# Patient Record
Sex: Female | Born: 1990
Health system: Southern US, Community
[De-identification: ages and names within clinical notes are randomized; demographics above are authoritative.]

## PROBLEM LIST (undated history)

## (undated) DIAGNOSIS — K219 Gastro-esophageal reflux disease without esophagitis: Secondary | ICD-10-CM

## (undated) HISTORY — DX: Gastro-esophageal reflux disease without esophagitis: K21.9

---

## 2015-05-12 ENCOUNTER — Emergency Department (HOSPITAL_COMMUNITY)
Admission: EM | Admit: 2015-05-12 | Discharge: 2015-05-12 | Disposition: A | Discharge status: Nursing Home (Includes ICF) | Payer: Medicaid Other | Source: Home / Self Care | Attending: Family Medicine | Admitting: Family Medicine

## 2015-05-12 ENCOUNTER — Encounter (HOSPITAL_COMMUNITY): Payer: Self-pay | Admitting: Emergency Medicine

## 2015-05-12 ENCOUNTER — Emergency Department (HOSPITAL_COMMUNITY)
Admission: EM | Admit: 2015-05-12 | Discharge: 2015-05-12 | Disposition: A | Discharge status: Home / Self Care | Payer: Medicaid Other | Attending: Emergency Medicine | Admitting: Emergency Medicine

## 2015-05-12 ENCOUNTER — Emergency Department (HOSPITAL_COMMUNITY): Payer: Medicaid Other

## 2015-05-12 DIAGNOSIS — M791 Myalgia: Secondary | ICD-10-CM | POA: Diagnosis present

## 2015-05-12 DIAGNOSIS — R509 Fever, unspecified: Secondary | ICD-10-CM | POA: Diagnosis not present

## 2015-05-12 DIAGNOSIS — R079 Chest pain, unspecified: Secondary | ICD-10-CM | POA: Diagnosis not present

## 2015-05-12 DIAGNOSIS — R000 Tachycardia, unspecified: Secondary | ICD-10-CM | POA: Diagnosis not present

## 2015-05-12 DIAGNOSIS — R0789 Other chest pain: Secondary | ICD-10-CM | POA: Insufficient documentation

## 2015-05-12 DIAGNOSIS — B349 Viral infection, unspecified: Secondary | ICD-10-CM | POA: Diagnosis not present

## 2015-05-12 LAB — COMPREHENSIVE METABOLIC PANEL
ALT: 14 U/L (ref 14–54)
ANION GAP: 9 (ref 5–15)
AST: 17 U/L (ref 15–41)
Albumin: 3.6 g/dL (ref 3.5–5.0)
Alkaline Phosphatase: 43 U/L (ref 38–126)
BILIRUBIN TOTAL: 0.6 mg/dL (ref 0.3–1.2)
BUN: 6 mg/dL (ref 6–20)
CO2: 24 mmol/L (ref 22–32)
Calcium: 9.1 mg/dL (ref 8.9–10.3)
Chloride: 100 mmol/L — ABNORMAL LOW (ref 101–111)
Creatinine, Ser: 0.67 mg/dL (ref 0.44–1.00)
GFR calc Af Amer: >60 mL/min (ref >60)
Glucose, Bld: 95 mg/dL (ref 65–99)
POTASSIUM: 3.1 mmol/L — AB (ref 3.5–5.1)
Sodium: 133 mmol/L — ABNORMAL LOW (ref 135–145)
TOTAL PROTEIN: 7.4 g/dL (ref 6.5–8.1)

## 2015-05-12 LAB — CBC WITH DIFFERENTIAL/PLATELET
Basophils Absolute: 0 10*3/uL (ref 0.0–0.1)
Basophils Relative: 0 %
Eosinophils Absolute: 0 10*3/uL (ref 0.0–0.7)
Eosinophils Relative: 0 %
HEMATOCRIT: 35.6 % — AB (ref 36.0–46.0)
HEMOGLOBIN: 12.1 g/dL (ref 12.0–15.0)
LYMPHS ABS: 1.4 10*3/uL (ref 0.7–4.0)
LYMPHS PCT: 20 %
MCH: 28.7 pg (ref 26.0–34.0)
MCHC: 34 g/dL (ref 30.0–36.0)
MCV: 84.4 fL (ref 78.0–100.0)
MONOS PCT: 17 %
Monocytes Absolute: 1.3 10*3/uL — ABNORMAL HIGH (ref 0.1–1.0)
NEUTROS ABS: 4.6 10*3/uL (ref 1.7–7.7)
NEUTROS PCT: 63 %
Platelets: 233 10*3/uL (ref 150–400)
RBC: 4.22 MIL/uL (ref 3.87–5.11)
RDW: 11.9 % (ref 11.5–15.5)
WBC: 7.3 10*3/uL (ref 4.0–10.5)

## 2015-05-12 LAB — POCT URINALYSIS DIP (DEVICE)
BILIRUBIN URINE: NEGATIVE
Glucose, UA: NEGATIVE mg/dL
Ketones, ur: 15 mg/dL — AB
LEUKOCYTES UA: NEGATIVE
Nitrite: NEGATIVE
Protein, ur: NEGATIVE mg/dL
SPECIFIC GRAVITY, URINE: 1.02 (ref 1.005–1.030)
Urobilinogen, UA: 0.2 mg/dL (ref 0.0–1.0)
pH: 5.5 (ref 5.0–8.0)

## 2015-05-12 LAB — POCT PREGNANCY, URINE: Preg Test, Ur: NEGATIVE

## 2015-05-12 MED ORDER — ACETAMINOPHEN 325 MG PO TABS
ORAL_TABLET | ORAL | Status: AC
  Filled 2015-05-12: qty 2

## 2015-05-12 MED ORDER — ACETAMINOPHEN 325 MG PO TABS
650.0000 mg | ORAL_TABLET | Freq: Once | ORAL | Status: AC
  Administered 2015-05-12: 650 mg via ORAL

## 2015-05-12 MED ORDER — SODIUM CHLORIDE 0.9 % IV BOLUS (SEPSIS): 1000.0000 mL | Freq: Once | INTRAVENOUS | Status: DC

## 2015-05-12 NOTE — ED Provider Notes (Signed)
CSN: [id number]     Arrival date & time 05/12/15  1941 History   First MD Initiated Contact with Patient 05/12/15 2004     Chief Complaint  Patient presents with  . Generalized Body Aches     (Consider location/radiation/quality/duration/timing/severity/associated sxs/prior Treatment) HPI Comments: This is a 24 year old normally healthy female who immigrated from [place] approximately one month ago.  Prior to her immigration.  She has been healthy.  She does report that she had a URI on arrival to the [place], which cleared spontaneously.  The past 3 days.  She's had generalized body aches, sore throat, chest discomfort, cough  which she has been treating with a pain medication from her country, which has helped her discomfort and fever.  Her last menstrual cycle was approximately one month ago.  She is not sexually active.  She does attend school  The history is provided by the patient. The history is limited by a language barrier. A language interpreter was used.    History reviewed. No pertinent past medical history. History reviewed. No pertinent past surgical history. History reviewed. No pertinent family history. Social History  Substance Use Topics  . Smoking status: Never Smoker   . Smokeless tobacco: None  . Alcohol Use: No   OB History    No data available     Review of Systems  Constitutional: Positive for fever.  HENT: Positive for sore throat.   Respiratory: Positive for chest tightness and shortness of breath. Negative for cough.   Gastrointestinal: Negative for nausea, diarrhea and constipation.  Genitourinary: Negative for vaginal bleeding and vaginal discharge.  Skin: Negative for rash.  All other systems reviewed and are negative.     Allergies  Review of patient's allergies indicates no known allergies.  Home Medications   Prior to Admission medications   Medication Sig Start Date End Date Taking? Authorizing Provider  PRESCRIPTION MEDICATION 2  medications from school clinic (one for pain)   Yes Historical Provider, MD   BP 106/73 mmHg  Pulse 100  Temp(Src) 99 F (37.2 C) (Oral)  Resp 16  SpO2 97%  LMP 04/10/2015 (Approximate) Physical Exam  Constitutional: She is oriented to person, place, and time. She appears well-developed and well-nourished.  HENT:  Head: Normocephalic.  Eyes: Pupils are equal, round, and reactive to light.  Neck: Normal range of motion.  Cardiovascular: Normal rate and regular rhythm.   Pulmonary/Chest: Effort normal and breath sounds normal.  Abdominal: Soft. Bowel sounds are normal.  Musculoskeletal: Normal range of motion.  Lymphadenopathy:    She has no cervical adenopathy.  Neurological: She is alert and oriented to person, place, and time.  Skin: Skin is warm. No rash noted. No pallor.  Nursing note and vitals reviewed.   ED Course  Procedures (including critical care time) Labs Review Labs Reviewed  CBC WITH DIFFERENTIAL/PLATELET - Abnormal; Notable for the following:    HCT 35.6 (*)    Monocytes Absolute 1.3 (*)    All other components within normal limits  COMPREHENSIVE METABOLIC PANEL - Abnormal; Notable for the following:    Sodium 133 (*)    Potassium 3.1 (*)    Chloride 100 (*)    All other components within normal limits    Imaging Review Dg Chest 2 View  05/12/2015  CLINICAL DATA:  Cough and fever.  Body aches. EXAM: CHEST  2 VIEW COMPARISON:  None. FINDINGS: The cardiomediastinal contours are normal. The lungs are clear. Pulmonary vasculature is normal. No consolidation, pleural  effusion, or pneumothorax. No acute osseous abnormalities are seen. IMPRESSION: No acute pulmonary process. Electronically Signed   By: Rubye Oaks M.D.   On: 05/12/2015 22:00   I have personally reviewed and evaluated these images and lab results as part of my medical decision-making.   EKG Interpretation None     urine sample obtained at urgent care is normal.  I will obtain chest  x-ray.  On arrival to our emergency room.  Patient is no longer febrile with a temperature of 99.4 and a pulse of 100.  She does not appear to be any distress at this time  MDM   Final diagnoses:  Viral syndrome         Earley Favor, NP 05/13/15 2254  Richardean Canal, MD 05/13/15 [phone]

## 2015-05-12 NOTE — Discharge Instructions (Signed)
It was nice seeing you today. I am sorry you don't feel well. I will like for you to go to the ED to get blood work done foe blood infection.   Fever, Adult A fever is an increase in the body's temperature. It is often defined as a temperature of 100 F (38C) or higher. Short mild or moderate fevers often have no long-term effects. They also often do not need treatment. Moderate or high fevers may make you feel uncomfortable. Sometimes, they can also be a sign of a serious illness or disease. The sweating that may happen with repeated fevers or fevers that last a while may also cause you to not have enough fluid in your body (dehydration). You can take your temperature with a thermometer to see if you have a fever. A measured temperature can change with:  Age.  Time of day.  Where the thermometer is placed:  Mouth (oral).  Rectum (rectal).  Ear (tympanic).  Underarm (axillary).  Forehead (temporal). HOME CARE Pay attention to any changes in your symptoms. Take these actions to help with your condition:  Take over-the-counter and prescription medicines only as told by your doctor. Follow the dosing instructions carefully.  If you were prescribed an antibiotic medicine, take it as told by your doctor. Do not stop taking the antibiotic even if you start to feel better.  Rest as needed.  Drink enough fluid to keep your pee (urine) clear or pale yellow.  Sponge yourself or bathe with room-temperature water as needed. This helps to lower your body temperature . Do not use ice water.  Do not wear too many blankets or heavy clothes. GET HELP IF:  You throw up (vomit).  You cannot eat or drink without throwing up.  You have watery poop (diarrhea).  It hurts when you pee.  Your symptoms do not get better with treatment.  You have new symptoms.  You feel very weak. GET HELP RIGHT AWAY IF:  You are short of breath or have trouble breathing.  You are dizzy or you pass out  (faint).  You feel confused.  You have signs of not having enough fluid in your body, such as:  A dry mouth.  Peeing less.  Looking pale.  You have very bad pain in your belly (abdomen).  You keep throwing up or having water poop.  You have a skin rash.  Your symptoms suddenly get worse.   This information is not intended to replace advice given to you by your health care provider. Make sure you discuss any questions you have with your health care provider.   Document Released: 03/18/2008 Document Revised: 02/28/2015 Document Reviewed: 08/03/2014 Elsevier Interactive Patient Education [employer]2016 Elsevier Inc.

## 2015-05-12 NOTE — ED Notes (Signed)
Pt roomed prior to Triage completion due to needed translator.

## 2015-05-12 NOTE — Discharge Instructions (Signed)
341

## 2015-05-12 NOTE — ED Notes (Signed)
Fever, general body aches.  Denies sore throat, pain in chest and back and with breathing per patient

## 2015-05-12 NOTE — ED Notes (Signed)
[place] [employer] div of Medical assistance card has DOB Sep 05, 1990

## 2015-05-12 NOTE — ED Provider Notes (Signed)
CSN: [id number]     Arrival date & time 05/12/15  1743 History   First MD Initiated Contact with Patient 05/12/15 1828     No chief complaint on file.  (Consider location/radiation/quality/duration/timing/severity/associated sxs/prior Treatment) Patient is a 24 y.o. female presenting with fever. The history is provided by the patient and a relative. No language interpreter was used.  Fever Temp source:  Subjective Severity:  Moderate Onset quality:  Gradual Duration:  2 days Timing:  Intermittent Progression:  Waxing and waning Worsened by:  Nothing tried Ineffective treatments: She took some pain medication but she does not know what it is. Associated symptoms: chest pain, dysuria, ear pain, headaches and myalgias   Associated symptoms: no cough, no diarrhea, no nausea, no sore throat and no vomiting   Patient recently migrated to the [place] from [place] last month. Chest pain: This is aggravated by deep breathing. Pain does not radiate and it is centrally located. She denies SOB.  No past medical history on file. No past surgical history on file. No family history on file. Social History  Substance Use Topics  . Smoking status: Not on file  . Smokeless tobacco: Not on file  . Alcohol Use: Not on file   OB History    No data available     Review of Systems  Constitutional: Positive for fever.  HENT: Positive for ear pain. Negative for sore throat.   Respiratory: Negative for cough.   Cardiovascular: Positive for chest pain.  Gastrointestinal: Negative for nausea, vomiting and diarrhea.  Genitourinary: Positive for dysuria.  Musculoskeletal: Positive for myalgias.  Neurological: Positive for headaches.  All other systems reviewed and are negative.   Allergies  Review of patient's allergies indicates not on file.  Home Medications   Prior to Admission medications   Not on File   Meds Ordered and Administered this Visit  Medications - No data to display  BP 119/79  mmHg  Pulse 124  Temp(Src) 102.5 F (39.2 C) (Oral)  Resp 22  SpO2 99% No data found.   Physical Exam  Constitutional: She is oriented to person, place, and time. She appears distressed.  Cardiovascular: Regular rhythm, normal heart sounds and intact distal pulses.  Tachycardia present.   No murmur heard. Pulmonary/Chest: Effort normal and breath sounds normal. No respiratory distress. She has no wheezes.  Abdominal: Soft. Bowel sounds are normal. She exhibits no distension and no mass. There is no tenderness.  Musculoskeletal: Normal range of motion. She exhibits no edema.  Neurological: She is alert and oriented to person, place, and time. No cranial nerve deficit.  Skin: She is not diaphoretic.  Vitals reviewed.   ED Course  Procedures (including critical care time)  Labs Review Labs Reviewed - No data to display  Imaging Review No results found.   Visual Acuity Review  Right Eye Distance:   Left Eye Distance:   Bilateral Distance:    Right Eye Near:   Left Eye Near:    Bilateral Near:      Urinalysis    Component Value Date/Time   LABSPEC 1.020 05/12/2015 1850   PHURINE 5.5 05/12/2015 1850   GLUCOSEU NEGATIVE 05/12/2015 1850   HGBUR MODERATE* 05/12/2015 1850   BILIRUBINUR NEGATIVE 05/12/2015 1850   KETONESUR 15* 05/12/2015 1850   PROTEINUR NEGATIVE 05/12/2015 1850   UROBILINOGEN 0.2 05/12/2015 1850   NITRITE NEGATIVE 05/12/2015 1850   LEUKOCYTESUR NEGATIVE 05/12/2015 1850    EKG: sinus tachycardia.     MDM  No diagnosis  found. Fever, unspecified fever cause  Chest pain, unspecified chest pain type  Sinus tachycardia (HCC)  Etiology of fever currently unclear. Differentials include sepsis. She appears very sick and in distress. I will like her to go to the ED for blood work. Since she recently came from [place] she might also have[place] malaria or other travel related disease.   EKG report reviewed by me: Sinus tachycardia. No Ischemic changes.  Likely related to fever. Plan to send to the ED for further evaluation and monitoring.    Doreene ElandKehinde T Neziah Vogelgesang, MD 05/12/15 [phone]

## 2015-05-12 NOTE — ED Notes (Signed)
Pt. reports generalized body aches , SOB , occasional cough and chest congestion onset last Thursday , denies fever or chills . Arabic interpreter used at triage .

## 2015-05-24 ENCOUNTER — Emergency Department (HOSPITAL_COMMUNITY)
Admission: EM | Admit: 2015-05-24 | Discharge: 2015-05-24 | Disposition: A | Discharge status: Home / Self Care | Payer: Medicaid Other | Attending: Emergency Medicine | Admitting: Emergency Medicine

## 2015-05-24 ENCOUNTER — Encounter (HOSPITAL_COMMUNITY): Payer: Self-pay

## 2015-05-24 DIAGNOSIS — R064 Hyperventilation: Secondary | ICD-10-CM | POA: Diagnosis not present

## 2015-05-24 DIAGNOSIS — R0602 Shortness of breath: Secondary | ICD-10-CM | POA: Diagnosis present
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
## 2015-05-24 NOTE — ED Notes (Signed)
[place] [employer] AT THE BEDSIDE.
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
## 2015-05-24 NOTE — ED Notes (Signed)
Bed: WA09 Expected date:  Expected time:  Means of arrival:  Comments: Triage 1 
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
## 2015-05-24 NOTE — ED Notes (Signed)
MD at bedside with RN help to interpret.

## 2015-05-24 NOTE — Discharge Instructions (Signed)
Hyperventilation °Hyperventilation is breathing that is deeper and more rapid than normal. It is usually associated with panic and anxiety. Hyperventilation can make you feel breathless. It is sometimes called overbreathing. Breathing out too much causes a decrease in the amount of carbon dioxide gas in the blood. This leads to tingling and numbness in the hands, feet, and around the mouth. If this continues, your fingers, hands, and toes may begin to spasm. Hyperventilation usually lasts 20-30 minutes and can be associated with other symptoms of panic and anxiety, including:  °· Chest pains or tightness. °· A pounding or irregular, racing heartbeat (palpitations). °· Dizziness. °· Lightheadedness. °· Dry mouth. °· Weakness. °· Confusion. °· Sleep disturbance. °CAUSES  °Sudden onset (acute) hyperventilation is usually triggered by acute stress, anxiety, or emotional upset. Long-term (chronic) and recurring hyperventilation can occur with chronic lung problems, such emphysema or asthma. Other causes include:  °· Nervousness. °· Stress. °· Stimulant, drug, or alcohol use. °· Lung disease. °· Infections, such as pneumonia. °· Heart problems. °· Severe pain. °· Waking from a bad dream. °· Pregnancy. °· Bleeding. °HOME CARE INSTRUCTIONS °· Learn and use breathing exercises that help you breathe from your diaphragm and abdomen. °· Practice relaxation techniques to reduce stress, such as visualization, meditation, and muscle release. °· During an attack, try breathing into a paper bag. This changes the carbon dioxide level and slows down breathing. °SEEK IMMEDIATE MEDICAL CARE IF: °· Your hyperventilation continues or gets worse. °MAKE SURE YOU: °· Understand these instructions. °· Will watch your condition. °· Will get help right away if you are not doing well or get worse. °  °This information is not intended to replace advice given to you by your health care provider. Make sure you discuss any questions you have with  your health care provider. °  °Document Released: 06/06/2000 Document Revised: 12/09/2011 Document Reviewed: 09/18/2011 °Elsevier Interactive Patient Education ©2016 Elsevier Inc. ° °
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
## 2015-06-27 DIAGNOSIS — N912 Amenorrhea, unspecified: Secondary | ICD-10-CM

## 2015-06-27 DIAGNOSIS — R1013 Epigastric pain: Secondary | ICD-10-CM

## 2015-06-27 NOTE — Patient Instructions (Signed)
Appointment scheduled at [facility] 07/02/15 @ 2:00 pm. Continue to use medication prviously given in ER.  Refer to Urgent Care if pain increases.

## 2015-06-27 NOTE — Congregational Nurse Program (Signed)
Congregational Nurse Program Note  Date of Encounter: 06/27/2015  Past Medical History: No past medical history on file.  Encounter Details:     CNP Questionnaire - 06/27/15 2336    Patient Demographics   Is this a new or existing patient? Existing   Patient is considered a/an Refugee   Race African   Patient Assistance   Location of Patient Assistance Not Applicable   Patient's financial/insurance status Medicaid   Uninsured Patient No   Patient referred to apply for the following financial assistance Not Applicable   Food insecurities addressed Not Applicable   Transportation assistance No   Assistance securing medications No   Educational health offerings Not Applicable   Encounter Details   Primary purpose of visit Acute Illness/Condition Visit   Was an Emergency Department visit averted? Yes   Does patient have a medical provider? Yes   Patient referred to Follow up with established PCP   Was a mental health screening completed? (GAINS tool) No   Does patient have dental issues? No   Since previous encounter, have you referred patient for abnormal blood pressure that resulted in a new diagnosis or medication change? No   Since previous encounter, have you referred patient for abnormal blood glucose that resulted in a new diagnosis or medication change? No   For Abstraction Use Only   Does patient have insurance? No     Office visit requesting referral to PCP to follow-up chronic R upper abdominal pain previously treated at [facility].  Interview required Arabic interpreter. Reports menses absent since 2 months. No previous pregnancies.

## 2015-07-02 ENCOUNTER — Ambulatory Visit: Payer: Medicaid Other | Admitting: Internal Medicine

## 2015-07-09 ENCOUNTER — Ambulatory Visit: Payer: Medicaid Other | Attending: Internal Medicine | Admitting: Internal Medicine

## 2015-07-09 ENCOUNTER — Encounter: Payer: Self-pay | Admitting: Internal Medicine

## 2015-07-09 VITALS — BP 115/76 | HR 93 | Temp 98.0°F | Resp 16 | Ht <= 58 in | Wt 126.8 lb

## 2015-07-09 DIAGNOSIS — F4329 Adjustment disorder with other symptoms: Secondary | ICD-10-CM

## 2015-07-09 DIAGNOSIS — K219 Gastro-esophageal reflux disease without esophagitis: Secondary | ICD-10-CM

## 2015-07-09 DIAGNOSIS — G4700 Insomnia, unspecified: Secondary | ICD-10-CM

## 2015-07-09 DIAGNOSIS — R109 Unspecified abdominal pain: Secondary | ICD-10-CM | POA: Diagnosis not present

## 2015-07-09 LAB — POCT URINALYSIS DIPSTICK
Bilirubin, UA: NEGATIVE
Glucose, UA: NEGATIVE
KETONES UA: NEGATIVE
Leukocytes, UA: NEGATIVE
Nitrite, UA: NEGATIVE
PH UA: 7.5
SPEC GRAV UA: 1.02
UROBILINOGEN UA: 1

## 2015-07-09 LAB — POCT URINE PREGNANCY: Preg Test, Ur: NEGATIVE

## 2015-07-09 MED ORDER — NAPROXEN 500 MG PO TABS: 500.0000 mg | ORAL_TABLET | Freq: Two times a day (BID) | ORAL | Status: DC

## 2015-07-09 MED ORDER — OMEPRAZOLE 20 MG PO CPDR: 20.0000 mg | DELAYED_RELEASE_CAPSULE | Freq: Every day | ORAL | Status: DC

## 2015-07-09 MED FILL — ?OMEPRAZOLE DR 20 MG CAPSUL: 20 | 30 days supply | Qty: 30 | Fill #0

## 2015-07-09 MED FILL — NAPROXEN 500 MG TABLET: 500 | 15 days supply | Qty: 30 | Fill #0

## 2015-07-09 NOTE — Progress Notes (Signed)
Patient ID: Ashley Holt, female   DOB: 03/25/1991, 25 y.o.   MRN: [id number]  [id number]  [id number]  DOB - 03/05/1991  CC:  Chief Complaint  Patient presents with  . Establish Care       HPI: Ashley Holt [place] is a 25 y.o. female here today to establish medical care. Patient has no past medical history and is not on any current medications. Patient reports that she has been having a burning sensation in her abdomen and chest after she eats. She feels like she is bloated and has gas. She has not tried anything for acid reflux since being in [place] for the past 3 months. She reports that while in [place] she was on a specific medication but she is unable to recall the name of the medication.  Patient is also concerned about menstrual irregularities since being in [place] for the past 3 months. She reports that in her country her cycles were always regular. She does admit to emotional stress and insomnia as well. She states that she only sleeps 3 hours per night due to worrying about different issues.   No Known Allergies Past Medical History  Diagnosis Date  . GERD (gastroesophageal reflux disease)    No current outpatient prescriptions on file prior to visit.   No current facility-administered medications on file prior to visit.   History reviewed. No pertinent family history. Social History   Social History  . Marital Status: Single    Spouse Name: N/A  . Number of Children: N/A  . Years of Education: N/A   Occupational History  . Not on file.   Social History Main Topics  . Smoking status: Never Smoker   . Smokeless tobacco: Not on file  . Alcohol Use: No  . Drug Use: No  . Sexual Activity: Not on file   Other Topics Concern  . Not on file   Social History Narrative    Review of Systems: Other than what is stated in HPI, all other systems are negative.   Objective:   Filed Vitals:   07/09/15 1451  BP: 115/76  Pulse: 93  Temp: 98 F (36.7 C)  Resp: 16     Physical Exam  Constitutional: She is oriented to person, place, and time.  Cardiovascular: Normal rate, regular rhythm and normal heart sounds.   Pulmonary/Chest: Effort normal and breath sounds normal.  Abdominal: Soft. Bowel sounds are normal. She exhibits no distension and no mass. There is no tenderness.  Neurological: She is alert and oriented to person, place, and time.  Skin: Skin is warm.    No results found for: WBC, HGB, HCT, MCV, PLT No results found for: CREATININE, BUN, NA, K, CL, CO2  No results found for: HGBA1C Lipid Panel  No results found for: CHOL, TRIG, HDL, CHOLHDL, VLDL, LDLCALC     Assessment and plan:   Ashley Holt was seen today for establish care.  Diagnoses and all orders for this visit:  Abdominal discomfort -     Urinalysis Dipstick -     POCT urine pregnancy -     naproxen (NAPROSYN) 500 MG tablet; Take 1 tablet (500 mg total) by mouth 2 (two) times daily with a meal. For menstrual cramps  Gastroesophageal reflux disease, esophagitis presence not specified -     Begin omeprazole (PRILOSEC) 20 MG capsule; Take 1 capsule (20 mg total) by mouth daily before breakfast. For acid reflux Discussed diet and weight with patient relating to acid reflux.  Went over things  that may exacerbate acid reflux such as tomatoes, spicy foods, coffee, carbonated beverages, chocolates, etc.  Advised patient to avoid laying down at least two hours after meals and sleep with HOB elevated.   Insomnia Patient will try Melatonin nightly for sleep. Went over sleep hygiene  Stress and adjustment reaction Went over coping techniques. Likely what is causing menstrual abnormalities and insomnia  Due to language barrier, an interpreter was present during the history-taking and subsequent discussion (and for part of the physical exam) with this patient.   Return if symptoms worsen or fail to improve.   Ambrose Finland, NP-C [facility] [phone] 07/09/2015, 3:04 PM

## 2015-07-09 NOTE — Progress Notes (Signed)
Interpreter line used Tarek ID# [id number] Patient here to establish care Complains of having a burning sensation after she eats and also Having some abd discomfort after she eats as well Patient takes no prescribed medications

## 2015-07-09 NOTE — Patient Instructions (Signed)
Melatonin 5 mg at bedtime for sleep----[facility]

## 2015-07-10 DIAGNOSIS — R1013 Epigastric pain: Secondary | ICD-10-CM

## 2015-07-12 ENCOUNTER — Encounter (HOSPITAL_COMMUNITY): Payer: Self-pay | Admitting: Emergency Medicine

## 2015-07-16 NOTE — Congregational Nurse Program (Signed)
Congregational Nurse Program Note  Date of Encounter: 07/10/2015  Past Medical History: Past Medical History  Diagnosis Date  . GERD (gastroesophageal reflux disease)     Encounter Details:     CNP Questionnaire - 07/16/15 2049    Patient Demographics   Is this a new or existing patient? Existing   Patient is considered a/an Refugee   Race African   Patient Assistance   Location of Patient Assistance Not Applicable   Patient's financial/insurance status Medicaid   Uninsured Patient No   Patient referred to apply for the following financial assistance Medicaid   Food insecurities addressed Not Applicable   Transportation assistance No   Assistance securing medications No   Educational health offerings Medications   Encounter Details   Primary purpose of visit Education/Health Concerns;Post ED/Hospitalization Visit   Was an Emergency Department visit averted? Not Applicable   Does patient have a medical provider? Yes   Patient referred to Follow up with established PCP   Was a mental health screening completed? (GAINS tool) No   Does patient have dental issues? No   Since previous encounter, have you referred patient for abnormal blood pressure that resulted in a new diagnosis or medication change? No   Since previous encounter, have you referred patient for abnormal blood glucose that resulted in a new diagnosis or medication change? No         Amb Nursing Assessment - 07/09/15 1451    Pre-visit preparation   Pre-visit preparation completed Yes   Pain Assessment   Pain Assessment No/denies pain      Office visit at [facility] with questions regarding PCP visit at [facility] visit on 07-09-15. Complained of abdominal/gastric pain several weeks and absence of menses. B/P 103/71. Pulse 71. Received medications (see chart), Naproxen 500 mg, Melatonin 5 mg/OTC,  for sleep and Omeprazole  from pharmacy as ordered, but need assistance understanding dose schedule. Pregnancy test  negative per report. Plan: Review discharge report with medications and lab values. Used Arabic telephone interpreter to review orders and interview. Plan: Schedule follow-up appointment with PCP. Develop medication chart for meds. Refer to [facility] for evaluation and assessment if abdominal pain continues. Collaborate with PCP as needed in care.

## 2015-09-18 DIAGNOSIS — R1013 Epigastric pain: Secondary | ICD-10-CM

## 2015-09-22 NOTE — Congregational Nurse Program (Unsigned)
Congregational Nurse Program Note  Date of Encounter: 09/18/2015  Past Medical History: Past Medical History  Diagnosis Date  . GERD (gastroesophageal reflux disease)     Encounter Details:     CNP Questionnaire - 09/18/15 0118    Patient Demographics   Is this a new or existing patient? Existing   Patient is considered a/an Refugee   Patient Assistance   Location of Patient Assistance Not Applicable   Patient's financial/insurance status Medicaid   Uninsured Patient No   Patient referred to apply for the following financial assistance Not Applicable   Food insecurities addressed Not Applicable   Transportation assistance No   Assistance securing medications No   Educational health offerings Navigating the healthcare system   Encounter Details   Primary purpose of visit Navigating the Healthcare System;Post PCP Visit   Was an Emergency Department visit averted? Not Applicable   Does patient have a medical provider? Yes   Patient referred to Follow up with established PCP   Was a mental health screening completed? (GAINS tool) No   Does patient have dental issues? No   Does patient have vision issues? No   Since previous encounter, have you referred patient for abnormal blood pressure that resulted in a new diagnosis or medication change? No   Since previous encounter, have you referred patient for abnormal blood glucose that resulted in a new diagnosis or medication change? No     Nurse office visit at [facility] to request assistance in scheduling PCP appointment for re-evaluation of reoccurring epigastric and ? abdominal pain. Arabic speaking [place] lady speaking limited English, employed at [employer] in [place] attending  English classes in between work schedule.  [facility], September 26, 2015 at 2:00pm. Ferol LuzMarietta Zechariah Bissonnette, RN/Congregational Nurse.

## 2015-09-26 ENCOUNTER — Ambulatory Visit: Payer: Self-pay | Admitting: Internal Medicine

## 2016-05-30 LAB — GLUCOSE, POCT (MANUAL RESULT ENTRY): POC Glucose: 109 mg/dl — AB (ref 70–99)

## 2016-07-28 ENCOUNTER — Other Ambulatory Visit: Payer: Self-pay

## 2016-07-28 ENCOUNTER — Encounter: Payer: Self-pay | Admitting: Family Medicine

## 2016-07-28 ENCOUNTER — Ambulatory Visit: Payer: Self-pay | Attending: Family Medicine | Admitting: Family Medicine

## 2016-07-28 VITALS — BP 94/63 | HR 75 | Temp 98.8°F | Resp 18 | Ht 59.0 in | Wt 119.4 lb

## 2016-07-28 DIAGNOSIS — K219 Gastro-esophageal reflux disease without esophagitis: Secondary | ICD-10-CM | POA: Insufficient documentation

## 2016-07-28 DIAGNOSIS — K9049 Malabsorption due to intolerance, not elsewhere classified: Secondary | ICD-10-CM | POA: Insufficient documentation

## 2016-07-28 DIAGNOSIS — Z79899 Other long term (current) drug therapy: Secondary | ICD-10-CM | POA: Insufficient documentation

## 2016-07-28 DIAGNOSIS — F5102 Adjustment insomnia: Secondary | ICD-10-CM | POA: Insufficient documentation

## 2016-07-28 DIAGNOSIS — Z9102 Food additives allergy status: Secondary | ICD-10-CM | POA: Insufficient documentation

## 2016-07-28 DIAGNOSIS — R002 Palpitations: Secondary | ICD-10-CM | POA: Insufficient documentation

## 2016-07-28 MED ORDER — RANITIDINE HCL 150 MG PO TABS: 150.0000 mg | ORAL_TABLET | Freq: Two times a day (BID) | ORAL | 2 refills | Status: DC

## 2016-07-28 MED ORDER — TRAZODONE HCL 50 MG PO TABS: 25.0000 mg | ORAL_TABLET | Freq: Every evening | ORAL | 0 refills | Status: DC | PRN

## 2016-07-28 MED ORDER — DIPHENHYDRAMINE HCL 25 MG PO CAPS: 25.0000 mg | ORAL_CAPSULE | Freq: Four times a day (QID) | ORAL | 0 refills | Status: DC | PRN

## 2016-07-28 MED ORDER — EPINEPHRINE 0.3 MG/0.3ML IJ SOAJ: 0.3000 mg | Freq: Once | INTRAMUSCULAR | 0 refills | Status: AC

## 2016-07-28 MED FILL — raNITIdine HCL 150 MG TABS: 150 | 30 days supply | Qty: 60 | Fill #0

## 2016-07-28 NOTE — Patient Instructions (Addendum)
Food Allergy Introduction A food allergy is when your body reacts to a food in a way that is not normal. The reaction can be gentle or very bad. Signs of a Gentle Reaction  Stuffy nose.  Tingling in the mouth.  An itchy, red rash.  Throwing up (vomiting).  Watery poop (diarrhea). Signs of a Very Bad Reaction  Puffiness (swelling). This may be on the lips, face, or tongue, or in the mouth or throat.  Breathing loudly (wheezing).  A hoarse voice.  Itchy, red, swollen areas of skin (hives).  Dizziness or light-headedness.  Fainting.  Trouble breathing or swallowing.  A tight feeling in the chest.  A very fast heartbeat. Follow these instructions at home: General instructions  Avoid the foods that you are allergic to.  Read food labels. Look for ingredients that you are allergic to.  When you are at a restaurant, tell your server that you have an allergy. Ask if your meal has an ingredient that you are allergic to.  Take medicines only as told by your doctor. Do not drive until the medicine has worn off, unless your doctor says it is okay.  Tell all people who care for you that you have a food allergy. This includes your doctor and dentist.  If you think that you might be allergic to something else, talk with your doctor. Do not eat a food to see if you are allergic to it without talking with your doctor first. If you have a very bad allergy:  Wear a bracelet or necklace that says you have an allergy.  Carry your allergy kit (anaphylaxis kit) or an allergy shot (epinephrine injection) with you all the time. Use them as told by your doctor.  Make sure that you, your family, and your boss know:  How to use your allergy kit.  How to give you an allergy shot.  If you use the medicine epinephrine:  Get more right away in case you have another reaction.  Get help. You can have a life-threatening reaction after taking the medicine. If you are being tested for an  allergy:  Follow a diet as told by your doctor.  Keep a food diary as told by your doctor. Every day, write down:  What you eat and drink and when.  What problems you have and when. Contact a doctor if:  The signs of your reaction have not gone away within 2 days.  The signs of your reaction get worse.  You have new signs of a reaction. Get help right away if:  You use the medicine epinephrine.  You are having a very bad reaction. Signs of a very bad reaction are:  Puffiness. This may be on the lips, face, or tongue, or in the mouth or throat.  Breathing loudly.  A hoarse voice.  Itchy, red swollen areas of skin.  Dizziness or light-headedness.  Fainting.  Trouble breathing or swallowing.  A tight feeling in the chest.  A very fast heartbeat. This information is not intended to replace advice given to you by your health care provider. Make sure you discuss any questions you have with your health care provider. Document Released: 11/27/2009 Document Revised: 11/15/2015 Document Reviewed: 03/21/2014  2017 Elsevier  Food Choices for Gastroesophageal Reflux Disease, Adult When you have gastroesophageal reflux disease (GERD), the foods you eat and your eating habits are very important. Choosing the right foods can help ease your discomfort. What guidelines do I need to follow?  Choose fruits,  vegetables, whole grains, and low-fat dairy products.  Choose low-fat meat, fish, and poultry.  Limit fats such as oils, salad dressings, butter, nuts, and avocado.  Keep a food diary. This helps you identify foods that cause symptoms.  Avoid foods that cause symptoms. These may be different for everyone.  Eat small meals often instead of 3 large meals a day.  Eat your meals slowly, in a place where you are relaxed.  Limit fried foods.  Cook foods using methods other than frying.  Avoid drinking alcohol.  Avoid drinking large amounts of liquids with your  meals.  Avoid bending over or lying down until 2-3 hours after eating. What foods are not recommended? These are some foods and drinks that may make your symptoms worse: Vegetables  Tomatoes. Tomato juice. Tomato and spaghetti sauce. Chili peppers. Onion and garlic. Horseradish. Fruits  Oranges, grapefruit, and lemon (fruit and juice). Meats  High-fat meats, fish, and poultry. This includes hot dogs, ribs, ham, sausage, salami, and bacon. Dairy  Whole milk and chocolate milk. Sour cream. Cream. Butter. Ice cream. Cream cheese. Drinks  Coffee and tea. Bubbly (carbonated) drinks or energy drinks. Condiments  Hot sauce. Barbecue sauce. Sweets/Desserts  Chocolate and cocoa. Donuts. Peppermint and spearmint. Fats and Oils  High-fat foods. This includes [place] fries and potato chips. Other  Vinegar. Strong spices. This includes black pepper, white pepper, red pepper, cayenne, curry powder, cloves, ginger, and chili powder. The items listed above may not be a complete list of foods and drinks to avoid. Contact your dietitian for more information.  This information is not intended to replace advice given to you by your health care provider. Make sure you discuss any questions you have with your health care provider. Document Released: 12/09/2011 Document Revised: 11/15/2015 Document Reviewed: 04/13/2013 Elsevier Interactive Patient Education  2017 Elsevier Inc.  Epinephrine injection (Auto-injector) What is this medicine? EPINEPHRINE (ep i NEF rin) is used for the emergency treatment of severe allergic reactions. You should keep this medicine with you at all times. This medicine may be used for other purposes; ask your health care provider or pharmacist if you have questions. COMMON BRAND NAME(S): Adrenaclick, Auvi-Q, epinephrinesnap, epinephrinesnap-v, EpiPen, EPIsnap Epinephrine, SYMJEPI, Twinject What should I tell my health care provider before I take this medicine? They need to know  if you have any of the following conditions: -diabetes -heart disease -high blood pressure -lung or breathing disease, like asthma -Parkinson's disease -thyroid disease -an unusual or allergic reaction to epinephrine, sulfites, other medicines, foods, dyes, or preservatives -pregnant or trying to get pregnant -breast-feeding How should I use this medicine? This medicine is for injection into the outer thigh. Your doctor or health care professional will instruct you on the proper use of the device during an emergency. Read all directions carefully and make sure you understand them. Do not use more often than directed. Talk to your pediatrician regarding the use of this medicine in children. Special care may be needed. This drug is commonly used in children. A special device is available for use in children. If you are giving this medicine to a young child, hold their leg firmly in place before and during the injection to prevent injury. Overdosage: If you think you have taken too much of this medicine contact a poison control center or emergency room at once. NOTE: This medicine is only for you. Do not share this medicine with others. What if I miss a dose? This does not apply. You should only  use this medicine for an allergic reaction. What may interact with this medicine? This medicine is only used during an emergency. Significant drug interactions are not likely during emergency use. This list may not describe all possible interactions. Give your health care provider a list of all the medicines, herbs, non-prescription drugs, or dietary supplements you use. Also tell them if you smoke, drink alcohol, or use illegal drugs. Some items may interact with your medicine. What should I watch for while using this medicine? Keep this medicine ready for use in the case of a severe allergic reaction. Make sure that you have the phone number of your doctor or health care professional and local hospital  ready. Remember to check the expiration date of your medicine regularly. You may need to have additional units of this medicine with you at work, school, or other places. Talk to your doctor or health care professional about your need for extra units. Some emergencies may require an additional dose. Check with your doctor or a health care professional before using an extra dose. After use, go to the nearest hospital or call 911. Avoid physical activity. Make sure the treating health care professional knows you have received an injection of this medicine. You will receive additional instructions on what to do during and after use of this medicine before a medical emergency occurs. What side effects may I notice from receiving this medicine? Side effects that you should report to your doctor or health care professional as soon as possible: -allergic reactions like skin rash, itching or hives, swelling of the face, lips, or tongue -breathing problems -chest pain -fast, irregular heartbeat -pain, tingling, numbness in the hands or feet -pain, redness, or irritation at site where injected -vomiting Side effects that usually do not require medical attention (report to your doctor or health care professional if they continue or are bothersome): -anxious -dizziness -dry mouth -headache -increased sweating -nausea -unusually weak or tired This list may not describe all possible side effects. Call your doctor for medical advice about side effects. You may report side effects to FDA at 1-800-FDA-1088. Where should I keep my medicine? Keep out of the reach of children. Store at room temperature between 15 and 30 degrees C (59 and 86 degrees F). Protect from light and heat. The solution should be clear in color. If the solution is discolored or contains particles it must be replaced. Throw away any unused medicine after the expiration date. Ask your doctor or pharmacist about proper disposal of the injector  if it is expired or has been used. Always replace your auto-injector before it expires. NOTE: This sheet is a summary. It may not cover all possible information. If you have questions about this medicine, talk to your doctor, pharmacist, or health care provider.  2017 Elsevier/Gold Standard (2014-11-13 12:24:50)

## 2016-07-28 NOTE — Progress Notes (Signed)
Patient is here to establish care  Patient complains of breaking out in hives after consuming bananas, oranges and strawberries. Patient states she was not allergic to these fruits in [place]. Patient has been in the states for one year.  Patient complains of heart racing intermittently at night when she lays down to sleep.  Patient denies pain at this time.  Patient declined the flu vaccine today.  Patient has not taken any medication today. Patient has not eaten today.

## 2016-07-28 NOTE — Progress Notes (Signed)
Subjective:  Patient ID: Ashley Holt, female    DOB: 05/31/1991  Age: 26 y.o. MRN: [id number]  CC: Establish Care   HPI Ashley Holt presents for complaints of itching when eating certain foods for the past 6 months. Reports hives and itching after eating certain foods. Reports symptoms after eating bananas, chicken, strawberries, eggs, and cheese. Denies any swelling of tongue, difficulty breathing, or GI upset. She also has c/o of heartburn after drinking orange juice or apple juice. Denies any epigastric pain. She also c/o increased heartbeat at night which started one month ago. Denies any history of heart disease or family history of sudden cardiac death prior to age 26.  Denies any anxiety but expresses concerns about keeping her job. Reports getting 5 to 6 hours of sleep per night.      Outpatient Medications Prior to Visit  Medication Sig Dispense Refill  . naproxen (NAPROSYN) 500 MG tablet Take 1 tablet (500 mg total) by mouth 2 (two) times daily with a meal. For menstrual cramps (Patient not taking: Reported on 07/28/2016) 30 tablet 0  . omeprazole (PRILOSEC) 20 MG capsule Take 1 capsule (20 mg total) by mouth daily before breakfast. For acid reflux (Patient not taking: Reported on 07/28/2016) 30 capsule 3  . PRESCRIPTION MEDICATION 2 medications from school clinic (one for pain)     No facility-administered medications prior to visit.     ROS Review of Systems  HENT: Negative.   Eyes: Negative.   Respiratory: Negative.   Cardiovascular: Positive for palpitations (at night).  Gastrointestinal: Negative.        Heartburn  Psychiatric/Behavioral: Positive for sleep disturbance.       Objective:  BP 94/63 (BP Location: Left Arm, Patient Position: Sitting, Cuff Size: Normal)   Pulse 75   Temp 98.8 F (37.1 C) (Oral)   Resp 18   Ht 4\' 11"  (1.499 m)   Wt 119 lb 6.4 oz (54.2 kg)   LMP 07/19/2016   SpO2 100%   BMI 24.12 kg/m   BP/Weight 07/28/2016 07/10/2015 07/09/2015    Systolic BP 94 103 115  Diastolic BP 63 71 76  Wt. (Lbs) 119.4 - 126.8  BMI 24.12 - 28.44     Physical Exam  HENT:  Right Ear: External ear normal.  Left Ear: External ear normal.  Nose: Nose normal.  Mouth/Throat: Oropharynx is clear and moist.  Eyes: Conjunctivae are normal.  Cardiovascular: Normal rate, regular rhythm, normal heart sounds and intact distal pulses.   Pulmonary/Chest: Effort normal and breath sounds normal.  Abdominal: Soft. Bowel sounds are normal.  Nursing note and vitals reviewed.   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 ranitidine (ZANTAC) 150 MG tablet    Sig: Take 1 tablet (150 mg total) by mouth 2 (two) times daily.    Dispense:  60 tablet    Refill:  2    Order Specific Question:   Supervising Provider    Answer:   Quentin Angst [id number]  . diphenhydrAMINE (BENADRYL) 25 mg capsule    Sig: Take 1 capsule (25 mg total) by mouth every 6 (six) hours as needed.    Dispense:  30 capsule    Refill:  0    Order Specific Question:   Supervising Provider    Answer:   Quentin Angst [id number]  . traZODone (DESYREL) 50 MG tablet    Sig: Take 0.5 tablets (25 mg total) by mouth at bedtime as  needed for sleep.    Dispense:  30 tablet    Refill:  0    Order Specific Question:   Supervising Provider    Answer:   Quentin Angst [id number]  . EPINEPHrine 0.3 mg/0.3 mL IJ SOAJ injection    Sig: Inject 0.3 mLs (0.3 mg total) into the muscle once.    Dispense:  1 Device    Refill:  0    Order Specific Question:   Supervising Provider    Answer:   Quentin Angst [id number]    Follow-up: Return if symptoms worsen or fail to improve.   Lizbeth Bark FNP

## 2016-07-29 MED FILL — traZODone HCL 50 MG TABS: 50 | 30 days supply | Qty: 15 | Fill #0

## 2016-08-10 ENCOUNTER — Emergency Department (HOSPITAL_COMMUNITY)
Admission: EM | Admit: 2016-08-10 | Discharge: 2016-08-10 | Disposition: A | Discharge status: Home / Self Care | Payer: Self-pay | Attending: Emergency Medicine | Admitting: Emergency Medicine

## 2016-08-10 ENCOUNTER — Encounter (HOSPITAL_COMMUNITY): Payer: Self-pay | Admitting: *Deleted

## 2016-08-10 DIAGNOSIS — R1013 Epigastric pain: Secondary | ICD-10-CM | POA: Insufficient documentation

## 2016-08-10 DIAGNOSIS — R110 Nausea: Secondary | ICD-10-CM | POA: Insufficient documentation

## 2016-08-10 LAB — COMPREHENSIVE METABOLIC PANEL
ALT: 16 U/L (ref 14–54)
ANION GAP: 10 (ref 5–15)
AST: 18 U/L (ref 15–41)
Albumin: 4.4 g/dL (ref 3.5–5.0)
Alkaline Phosphatase: 40 U/L (ref 38–126)
BUN: 14 mg/dL (ref 6–20)
CHLORIDE: 101 mmol/L (ref 101–111)
CO2: 26 mmol/L (ref 22–32)
CREATININE: 0.66 mg/dL (ref 0.44–1.00)
Calcium: 9.4 mg/dL (ref 8.9–10.3)
Glucose, Bld: 89 mg/dL (ref 65–99)
Potassium: 3.6 mmol/L (ref 3.5–5.1)
Sodium: 137 mmol/L (ref 135–145)
Total Bilirubin: 0.7 mg/dL (ref 0.3–1.2)
Total Protein: 7.5 g/dL (ref 6.5–8.1)

## 2016-08-10 LAB — URINALYSIS, ROUTINE W REFLEX MICROSCOPIC
Bacteria, UA: NONE SEEN
Bilirubin Urine: NEGATIVE
GLUCOSE, UA: NEGATIVE mg/dL
KETONES UR: NEGATIVE mg/dL
Nitrite: NEGATIVE
PROTEIN: NEGATIVE mg/dL
Specific Gravity, Urine: 1.015 (ref 1.005–1.030)
pH: 5 (ref 5.0–8.0)

## 2016-08-10 LAB — CBC
HCT: 37.5 % (ref 36.0–46.0)
Hemoglobin: 12.5 g/dL (ref 12.0–15.0)
MCH: 28.7 pg (ref 26.0–34.0)
MCHC: 33.3 g/dL (ref 30.0–36.0)
MCV: 86 fL (ref 78.0–100.0)
PLATELETS: 288 10*3/uL (ref 150–400)
RBC: 4.36 MIL/uL (ref 3.87–5.11)
RDW: 11.8 % (ref 11.5–15.5)
WBC: 5 10*3/uL (ref 4.0–10.5)

## 2016-08-10 LAB — I-STAT BETA HCG BLOOD, ED (MC, WL, AP ONLY): I-stat hCG, quantitative: <5 m[IU]/mL (ref <5)

## 2016-08-10 LAB — LIPASE, BLOOD: LIPASE: 20 U/L (ref 11–51)

## 2016-08-10 MED ORDER — SUCRALFATE 1 GM/10ML PO SUSP
1.0000 g | Freq: Three times a day (TID) | ORAL | 0 refills | Status: DC
Start: 2016-08-10 — End: 2017-01-17

## 2016-08-10 MED ORDER — ONDANSETRON 4 MG PO TBDP: 4.0000 mg | ORAL_TABLET | Freq: Three times a day (TID) | ORAL | 0 refills | Status: DC | PRN

## 2016-08-10 MED ORDER — ONDANSETRON 4 MG PO TBDP
4.0000 mg | ORAL_TABLET | Freq: Once | ORAL | Status: AC
  Administered 2016-08-10: 4 mg via ORAL
  Filled 2016-08-10: qty 1

## 2016-08-10 MED ORDER — GI COCKTAIL ~~LOC~~
30.0000 mL | Freq: Once | ORAL | Status: AC
  Administered 2016-08-10: 30 mL via ORAL
  Filled 2016-08-10: qty 30

## 2016-08-10 NOTE — ED Provider Notes (Signed)
Emergency Department Provider Note   I have reviewed the triage vital signs and the nursing notes.  History taken using a video Arabic interpreter.   HISTORY  Chief Complaint Headache and Abdominal Pain   HPI Ashley Holt is a 26 y.o. female with PMH of GERD presents to the emergency department for evaluation of abdominal discomfort for the past 3 days. She describes her pain is epigastric with radiation to both the right and left sides. She has a sharp pain in the center of her epigastrium with some burning sensation over the flanks. She denies any associated vomiting, nausea, diarrhea. No exacerbating or alleviating factors. No vaginal bleeding or discharge. The patient's last menstrual period was January 27. Patient states that she is sexually active and is concerned that she may be pregnant. No fevers or chills. No past surgical history.    Past Medical History:  Diagnosis Date  . GERD (gastroesophageal reflux disease)     There are no active problems to display for this patient.   History reviewed. No pertinent surgical history.  Current Outpatient Rx  . Order #: [id number] Class: OTC  . Order #: [id number] Class: Normal  . Order #: [id number] Class: Normal  . Order #: [id number] Class: Normal  . Order #: [id number] Class: Print  . Order #: [id number] Class: Historical Med  . Order #: [id number] Class: Normal  . Order #: [id number] Class: Print  . Order #: [id number] Class: Normal    Allergies Patient has no known allergies.  History reviewed. No pertinent family history.  Social History Social History  Substance Use Topics  . Smoking status: Never Smoker  . Smokeless tobacco: Never Used  . Alcohol use No    Review of Systems  Constitutional: No fever/chills Eyes: No visual changes. ENT: No sore throat. Cardiovascular: Denies chest pain. Respiratory: Denies shortness of breath. Gastrointestinal: Positive epigastric abdominal pain.  No nausea, no vomiting.  No  diarrhea.  No constipation. Genitourinary: Negative for dysuria. Musculoskeletal: Negative for back pain. Skin: Negative for rash. Neurological: Negative for headaches, focal weakness or numbness.  10-point ROS otherwise negative.  ____________________________________________   PHYSICAL EXAM:  VITAL SIGNS: ED Triage Vitals  Enc Vitals Group     BP 08/10/16 1501 120/90     Pulse Rate 08/10/16 1501 71     Resp 08/10/16 1501 18     Temp 08/10/16 1501 98.7 F (37.1 C)     Temp Source 08/10/16 1501 Oral     SpO2 08/10/16 1501 100 %     Pain Score 08/10/16 1636 4    Constitutional: Alert and oriented. Well appearing and in no acute distress. Eyes: Conjunctivae are normal.  Head: Atraumatic. Nose: No congestion/rhinnorhea. Mouth/Throat: Mucous membranes are moist.  Oropharynx non-erythematous. Neck: No stridor.   Cardiovascular: Normal rate, regular rhythm. Good peripheral circulation. Grossly normal heart sounds.   Respiratory: Normal respiratory effort.  No retractions. Lungs CTAB. Gastrointestinal: Soft and nontender. No rebound or guarding. No distention.  Musculoskeletal: No lower extremity tenderness nor edema. No gross deformities of extremities. Neurologic:  Normal speech and language. No gross focal neurologic deficits are appreciated.  Skin:  Skin is warm, dry and intact. No rash noted.  ____________________________________________   LABS (all labs ordered are listed, but only abnormal results are displayed)  Labs Reviewed  URINALYSIS, ROUTINE W REFLEX MICROSCOPIC - Abnormal; Notable for the following:       Result Value   Hgb urine dipstick SMALL (*)    Leukocytes, UA MODERATE (*)    Squamous Epithelial /  LPF 0-5 (*)    All other components within normal limits  LIPASE, BLOOD  COMPREHENSIVE METABOLIC PANEL  CBC  I-STAT BETA HCG BLOOD, ED ([facility], [facility], [facility] ONLY)    ____________________________________________  RADIOLOGY  None ____________________________________________   PROCEDURES  Procedure(s) performed:   Procedures  None ____________________________________________   INITIAL IMPRESSION / ASSESSMENT AND PLAN / ED COURSE  Pertinent labs & imaging results that were available during my care of the patient were reviewed by me and considered in my medical decision making (see chart for details).  Patient resents to the emergency department for evaluation of abdominal pain past 3 days. She has no tenderness to palpation on my exam. Labs are unremarkable. Patient is not pregnant. Symptoms seem most consistent with gastritis. Plan for GI cocktail and Zofran. Will PO challenge and reassess.   Patient feeling better on re-evaluation. No change in abdominal exam. Plan for discharge with symptom mgmt and PCP follow up.   At this time, I do not feel there is any life-threatening condition present. I have reviewed and discussed all results (EKG, imaging, lab, urine as appropriate), exam findings with patient. I have reviewed nursing notes and appropriate previous records.  I feel the patient is safe to be discharged home without further emergent workup. Discussed usual and customary return precautions. Patient and family (if present) verbalize understanding and are comfortable with this plan.  Patient will follow-up with their primary care provider. If they do not have a primary care provider, information for follow-up has been provided to them. All questions have been answered.  ____________________________________________  FINAL CLINICAL IMPRESSION(S) / ED DIAGNOSES  Final diagnoses:  Epigastric pain  Nausea     MEDICATIONS GIVEN DURING THIS VISIT:  Medications  gi cocktail (Maalox,Lidocaine,Donnatal) (30 mLs Oral Given 08/10/16 1650)  ondansetron (ZOFRAN-ODT) disintegrating tablet 4 mg (4 mg Oral Given 08/10/16 1650)     NEW OUTPATIENT  MEDICATIONS STARTED DURING THIS VISIT:  Discharge Medication List as of 08/10/2016  6:07 PM    START taking these medications   Details  ondansetron (ZOFRAN ODT) 4 MG disintegrating tablet Take 1 tablet (4 mg total) by mouth every 8 (eight) hours as needed for nausea or vomiting., Starting Sun 08/10/2016, Print    sucralfate (CARAFATE) 1 GM/10ML suspension Take 10 mLs (1 g total) by mouth 4 (four) times daily -  with meals and at bedtime., Starting Sun 08/10/2016, Print          Note:  This document was prepared using Dragon voice recognition software and may include unintentional dictation errors.  Alona BeneJoshua Jordyne Poehlman, MD Emergency Medicine   Maia PlanJoshua G Livian Vanderbeck, MD 08/12/16 1016

## 2016-08-10 NOTE — ED Notes (Signed)
Papers reviewed with patient and interpreter line to ensure understanding

## 2016-08-10 NOTE — ED Triage Notes (Signed)
Used translator phone, pt having abd pain x 3 days. Denies n/v/d.

## 2016-08-10 NOTE — Discharge Instructions (Signed)

## 2016-08-11 MED FILL — ONDANSETRON ODT 4 MG TABLET: 4 | 6 days supply | Qty: 20 | Fill #0

## 2016-08-11 MED FILL — CARAFATE 1 GM/10 ML SUSP: 1 | 10 days supply | Qty: 420 | Fill #0

## 2016-09-04 ENCOUNTER — Inpatient Hospital Stay: Payer: Medicaid Other | Admitting: Family Medicine

## 2016-09-08 ENCOUNTER — Ambulatory Visit (INDEPENDENT_AMBULATORY_CARE_PROVIDER_SITE_OTHER): Payer: Self-pay | Admitting: Physician Assistant

## 2016-09-08 ENCOUNTER — Encounter (INDEPENDENT_AMBULATORY_CARE_PROVIDER_SITE_OTHER): Payer: Self-pay | Admitting: Physician Assistant

## 2016-09-08 VITALS — BP 111/77 | HR 92 | Temp 98.1°F | Ht 60.5 in | Wt 118.0 lb

## 2016-09-08 DIAGNOSIS — R1013 Epigastric pain: Secondary | ICD-10-CM

## 2016-09-08 DIAGNOSIS — R8281 Pyuria: Secondary | ICD-10-CM

## 2016-09-08 DIAGNOSIS — R195 Other fecal abnormalities: Secondary | ICD-10-CM

## 2016-09-08 DIAGNOSIS — K642 Third degree hemorrhoids: Secondary | ICD-10-CM

## 2016-09-08 DIAGNOSIS — N390 Urinary tract infection, site not specified: Secondary | ICD-10-CM

## 2016-09-08 DIAGNOSIS — K5900 Constipation, unspecified: Secondary | ICD-10-CM

## 2016-09-08 LAB — POCT URINALYSIS DIPSTICK
Bilirubin, UA: NEGATIVE
Glucose, UA: NEGATIVE
LEUKOCYTES UA: NEGATIVE
NITRITE UA: NEGATIVE
Protein, UA: NEGATIVE
SPEC GRAV UA: 1.03 (ref 1.030–1.035)
UROBILINOGEN UA: 0.2 (ref ?–2.0)
pH, UA: 5 (ref 5.0–8.0)

## 2016-09-08 LAB — POCT URINE PREGNANCY: PREG TEST UR: NEGATIVE

## 2016-09-08 MED ORDER — HYDROCORTISONE 2.5 % RE CREA: 1.0000 "application " | TOPICAL_CREAM | Freq: Two times a day (BID) | RECTAL | 0 refills | Status: DC

## 2016-09-08 MED ORDER — POLYETHYLENE GLYCOL 3350 17 GM/SCOOP PO POWD: 17.0000 g | Freq: Every day | ORAL | 1 refills | Status: DC

## 2016-09-08 MED ORDER — ONDANSETRON HCL 4 MG PO TABS: 4.0000 mg | ORAL_TABLET | Freq: Three times a day (TID) | ORAL | 0 refills | Status: DC | PRN

## 2016-09-08 MED ORDER — OMEPRAZOLE 40 MG PO CPDR: 40.0000 mg | DELAYED_RELEASE_CAPSULE | Freq: Every day | ORAL | 3 refills | Status: DC

## 2016-09-08 MED FILL — HYDROCORTISONE 2.5% CREAM: 2.5 | 30 days supply | Qty: 30 | Fill #0

## 2016-09-08 MED FILL — OMEPRAZOLE DR 40 MG CAPSULE: 40 | 30 days supply | Qty: 30 | Fill #0

## 2016-09-08 MED FILL — ONDANSETRON HCL 4 MG TABLET: 4 | 7 days supply | Qty: 20 | Fill #0

## 2016-09-08 MED FILL — POLYETHYLENE GLYCOL 3350: 30 days supply | Qty: 510 | Fill #0

## 2016-09-08 NOTE — Patient Instructions (Addendum)
Please watch for rectal bleeding and black stool. Go to ED if excessive bleeding occurs per rectum.  Constipation, Adult Constipation is when a person has fewer bowel movements in a week than normal, has difficulty having a bowel movement, or has stools that are dry, hard, or larger than normal. Constipation may be caused by an underlying condition. It may become worse with age if a person takes certain medicines and does not take in enough fluids. Follow these instructions at home: Eating and drinking    Eat foods that have a lot of fiber, such as fresh fruits and vegetables, whole grains, and beans.  Limit foods that are high in fat, low in fiber, or overly processed, such as french fries, hamburgers, cookies, candies, and soda.  Drink enough fluid to keep your urine clear or pale yellow. General instructions   Exercise regularly or as told by your health care provider.  Go to the restroom when you have the urge to go. Do not hold it in.  Take over-the-counter and prescription medicines only as told by your health care provider. These include any fiber supplements.  Practice pelvic floor retraining exercises, such as deep breathing while relaxing the lower abdomen and pelvic floor relaxation during bowel movements.  Watch your condition for any changes.  Keep all follow-up visits as told by your health care provider. This is important. Contact a health care provider if:  You have pain that gets worse.  You have a fever.  You do not have a bowel movement after 4 days.  You vomit.  You are not hungry.  You lose weight.  You are bleeding from the anus.  You have thin, pencil-like stools. Get help right away if:  You have a fever and your symptoms suddenly get worse.  You leak stool or have blood in your stool.  Your abdomen is bloated.  You have severe pain in your abdomen.  You feel dizzy or you faint. This information is not intended to replace advice given to  you by your health care provider. Make sure you discuss any questions you have with your health care provider. Document Released: 03/07/2004 Document Revised: 12/28/2015 Document Reviewed: 11/28/2015 Elsevier Interactive Patient Education  2017 [employer]

## 2016-09-08 NOTE — Progress Notes (Signed)
Subjective:  Patient ID: Calla Kicks, female    DOB: 08-Nov-1990  Age: 26 y.o. MRN: [id number]  CC: epigastric pain   HPI Gisella Alwine is a 26 y.o. female with a recently medical hx of epigastric pain presents for ED follow up of epigastric pain and nausea on 08/10/16. She was found to have moderate leukocytes on UA.Marland Kitchen No abx rx'ed. Discharged on Zofran. Has not had nausea since taking zofran. Lastly, she reports constipation with possible hemorrhoids. Notices a dark green stool. Denies fever, chills, current nausea, vomiting, acid reflux, hematochezia, chest pain, shortness of breath, headache, rash, swelling, or diarrhea.  * Interpreter service used  CBC Latest Ref Rng & Units 08/10/2016 05/12/2015  WBC 4.0 - 10.5 K/uL 5.0 7.3  Hemoglobin 12.0 - 15.0 g/dL 40.9 81.1  Hematocrit 91.4 - 46.0 % 37.5 35.6(L)  Platelets 150 - 400 K/uL 288 233     Outpatient Medications Prior to Visit  Medication Sig Dispense Refill  . diphenhydrAMINE (BENADRYL) 25 mg capsule Take 1 capsule (25 mg total) by mouth every 6 (six) hours as needed. 30 capsule 0  . EPINEPHrine 0.3 mg/0.3 mL IJ SOAJ injection Inject 0.3 mLs (0.3 mg total) into the muscle once. 1 Device 0  . naproxen (NAPROSYN) 500 MG tablet Take 1 tablet (500 mg total) by mouth 2 (two) times daily with a meal. For menstrual cramps (Patient not taking: Reported on 07/28/2016) 30 tablet 0  . omeprazole (PRILOSEC) 20 MG capsule Take 1 capsule (20 mg total) by mouth daily before breakfast. For acid reflux (Patient not taking: Reported on 07/28/2016) 30 capsule 3  . ondansetron (ZOFRAN ODT) 4 MG disintegrating tablet Take 1 tablet (4 mg total) by mouth every 8 (eight) hours as needed for nausea or vomiting. 20 tablet 0  . PRESCRIPTION MEDICATION 2 medications from school clinic (one for pain)    . ranitidine (ZANTAC) 150 MG tablet Take 1 tablet (150 mg total) by mouth 2 (two) times daily. 60 tablet 2  . sucralfate (CARAFATE) 1 GM/10ML suspension Take 10 mLs  (1 g total) by mouth 4 (four) times daily -  with meals and at bedtime. 420 mL 0  . traZODone (DESYREL) 50 MG tablet Take 0.5 tablets (25 mg total) by mouth at bedtime as needed for sleep. 30 tablet 0   No facility-administered medications prior to visit.      ROS Review of Systems  Constitutional: Negative for chills, fever and malaise/fatigue.  Eyes: Negative for blurred vision.  Respiratory: Negative for shortness of breath.   Cardiovascular: Negative for chest pain and palpitations.  Gastrointestinal: Positive for abdominal pain and melena (dark green and black). Negative for blood in stool and nausea.  Genitourinary: Negative for dysuria and hematuria.  Musculoskeletal: Negative for joint pain and myalgias.  Skin: Negative for rash.  Neurological: Negative for tingling and headaches.  Psychiatric/Behavioral: Negative for depression. The patient is not nervous/anxious.     Objective:  BP 111/77 (BP Location: Left Arm, Patient Position: Sitting, Cuff Size: Normal)   Pulse 92   Temp 98.1 F (36.7 C) (Oral)   Ht 5' 0.5" (1.537 m)   Wt 118 lb (53.5 kg)   LMP 08/03/2016 (Exact Date)   SpO2 99%   BMI 22.67 kg/m   BP/Weight 09/08/2016 08/10/2016 07/28/2016  Systolic BP 111 113 94  Diastolic BP 77 78 63  Wt. (Lbs) 118 - 119.4  BMI 22.67 - 24.12      Physical Exam  Constitutional: She is  oriented to person, place, and time.  Eyes: No scleral icterus.  Neck: No thyromegaly present.  Cardiovascular: Normal rate, regular rhythm and normal heart sounds.   Pulmonary/Chest: Effort normal and breath sounds normal.  Abdominal: Soft. She exhibits no distension. There is tenderness (mild epigastric pain). There is no rebound and no guarding.  No hepatosplenomegaly felt, normal bowel sounds.  Genitourinary:  Genitourinary Comments: Small external hemorrhoid  Musculoskeletal: She exhibits no edema.  Neurological: She is alert and oriented to person, place, and time.  Skin: Skin is  warm and dry. No rash noted. No erythema. No pallor.  Psychiatric: She has a normal mood and affect. Her behavior is normal. Thought content normal.     Assessment & Plan:   1. Abdominal pain, epigastric - H. pylori antibody, IgG - Lipase - CBC with Differential/Platelet - Comprehensive metabolic panel - Omeprazole 40 mg qday   2. Constipation, unspecified constipation type - Miralax  3. Hemorrhoid - Anusol rectal cream  3. Pyuria - Moderate leukocytes at ED visit. - Urinalysis Dipstick with trace ketone and small blood today - POCT urine pregnancy negative  4. Dark stool - no fecal occult blood POCT. Will wait for lab result on immunochemical test.  - Unknown at this point if dark stool is truly melena.     Follow-up: Will call with results.  Loletta Specteroger David Gomez PA

## 2016-09-09 ENCOUNTER — Other Ambulatory Visit (INDEPENDENT_AMBULATORY_CARE_PROVIDER_SITE_OTHER): Payer: Self-pay | Admitting: Physician Assistant

## 2016-09-09 DIAGNOSIS — A048 Other specified bacterial intestinal infections: Secondary | ICD-10-CM

## 2016-09-09 LAB — CBC WITH DIFFERENTIAL/PLATELET
Basophils Absolute: 0 10*3/uL (ref 0.0–0.2)
Basos: 0 %
EOS (ABSOLUTE): 0.3 10*3/uL (ref 0.0–0.4)
Eos: 6 %
Hematocrit: 36.9 % (ref 34.0–46.6)
Hemoglobin: 12.4 g/dL (ref 11.1–15.9)
IMMATURE GRANULOCYTES: 0 %
Immature Grans (Abs): 0 10*3/uL (ref 0.0–0.1)
LYMPHS ABS: 1.9 10*3/uL (ref 0.7–3.1)
Lymphs: 37 %
MCH: 28.9 pg (ref 26.6–33.0)
MCHC: 33.6 g/dL (ref 31.5–35.7)
MCV: 86 fL (ref 79–97)
MONOS ABS: 0.4 10*3/uL (ref 0.1–0.9)
Monocytes: 7 %
NEUTROS PCT: 50 %
Neutrophils Absolute: 2.5 10*3/uL (ref 1.4–7.0)
PLATELETS: 267 10*3/uL (ref 150–379)
RBC: 4.29 x10E6/uL (ref 3.77–5.28)
RDW: 13.1 % (ref 12.3–15.4)
WBC: 5.1 10*3/uL (ref 3.4–10.8)

## 2016-09-09 LAB — COMPREHENSIVE METABOLIC PANEL
ALBUMIN: 4.4 g/dL (ref 3.5–5.5)
ALK PHOS: 52 IU/L (ref 39–117)
ALT: 12 IU/L (ref 0–32)
AST: 14 IU/L (ref 0–40)
Albumin/Globulin Ratio: 1.8 (ref 1.2–2.2)
BUN / CREAT RATIO: 14 (ref 9–23)
BUN: 9 mg/dL (ref 6–20)
Bilirubin Total: 0.3 mg/dL (ref 0.0–1.2)
CHLORIDE: 102 mmol/L (ref 96–106)
CO2: 24 mmol/L (ref 18–29)
CREATININE: 0.64 mg/dL (ref 0.57–1.00)
Calcium: 9.3 mg/dL (ref 8.7–10.2)
GFR calc Af Amer: 140 mL/min/{1.73_m2} (ref >59)
GFR calc non Af Amer: 122 mL/min/{1.73_m2} (ref >59)
GLUCOSE: 82 mg/dL (ref 65–99)
Globulin, Total: 2.5 g/dL (ref 1.5–4.5)
Potassium: 4 mmol/L (ref 3.5–5.2)
Sodium: 141 mmol/L (ref 134–144)
Total Protein: 6.9 g/dL (ref 6.0–8.5)

## 2016-09-09 LAB — LIPASE: LIPASE: 19 U/L (ref 14–72)

## 2016-09-09 LAB — H. PYLORI ANTIBODY, IGG: H PYLORI IGG: 5.72 {index_val} — AB (ref 0.00–0.79)

## 2016-09-09 LAB — FECAL OCCULT BLOOD, IMMUNOCHEMICAL: FECAL OCCULT BLD: NEGATIVE

## 2016-09-09 MED ORDER — CLARITHROMYCIN 500 MG PO TABS: 500.0000 mg | ORAL_TABLET | Freq: Two times a day (BID) | ORAL | 0 refills | Status: AC

## 2016-09-09 MED FILL — ?CLARITHROMYCIN 500 MG TABS: 500 MG | 14 days supply | Qty: 28 | Fill #0

## 2016-09-09 NOTE — Progress Notes (Signed)
Instructed nurse to notify patient.

## 2016-10-31 MED FILL — CIPROFLOXACIN HCL 500 MG TA: 500 | 10 days supply | Qty: 20 | Fill #0

## 2017-01-16 ENCOUNTER — Encounter (INDEPENDENT_AMBULATORY_CARE_PROVIDER_SITE_OTHER): Payer: Self-pay | Admitting: Physician Assistant

## 2017-01-16 ENCOUNTER — Ambulatory Visit (INDEPENDENT_AMBULATORY_CARE_PROVIDER_SITE_OTHER): Payer: BLUE CROSS/BLUE SHIELD | Admitting: Physician Assistant

## 2017-01-16 VITALS — BP 107/62 | HR 82 | Temp 98.3°F | Wt 124.2 lb

## 2017-01-16 DIAGNOSIS — A048 Other specified bacterial intestinal infections: Secondary | ICD-10-CM

## 2017-01-16 DIAGNOSIS — R1013 Epigastric pain: Secondary | ICD-10-CM | POA: Diagnosis not present

## 2017-01-16 DIAGNOSIS — G8929 Other chronic pain: Secondary | ICD-10-CM

## 2017-01-16 DIAGNOSIS — R1011 Right upper quadrant pain: Secondary | ICD-10-CM | POA: Diagnosis not present

## 2017-01-16 MED ORDER — OMEPRAZOLE 40 MG PO CPDR: 40.0000 mg | DELAYED_RELEASE_CAPSULE | Freq: Every day | ORAL | 3 refills | Status: DC

## 2017-01-16 MED FILL — OMEPRAZOLE DR 40 MG CAPSULE: 40 | 30 days supply | Qty: 30 | Fill #0

## 2017-01-16 NOTE — Progress Notes (Signed)
Subjective:  Patient ID: Ashley Holt, female    DOB: 08/26/1990  Age: 26 y.o. MRN: [id number]  CC: f/u epigastric pain  HPI Ashley Holt is a 26 y.o. female with a PMH of GERD presents to f/u on epigastric pain. She was found to have positive H pylori IgG on 09/09/16. Antibiotics sent to pharmacy but it is unclear if she took them. Omeprazole was temporarily helpful but patient is no longer taking and still feels epigastric pain. Pain is aggravated when eating or drinking anything. Does not endorse f/c/n/v, melena, or hematochezia. Does not endorse any other symptoms or complaints.  * Encounter complicated by language barrier. Interpretation services did not work after several attempts. Pt speaks little english. Google translate used for key questions.    Outpatient Medications Prior to Visit  Medication Sig Dispense Refill  . diphenhydrAMINE (BENADRYL) 25 mg capsule Take 1 capsule (25 mg total) by mouth every 6 (six) hours as needed. (Patient not taking: Reported on 01/16/2017) 30 capsule 0  . EPINEPHrine 0.3 mg/0.3 mL IJ SOAJ injection Inject 0.3 mLs (0.3 mg total) into the muscle once. 1 Device 0  . hydrocortisone (ANUSOL-HC) 2.5 % rectal cream Place 1 application rectally 2 (two) times daily. (Patient not taking: Reported on 01/16/2017) 30 g 0  . omeprazole (PRILOSEC) 40 MG capsule Take 1 capsule (40 mg total) by mouth daily. (Patient not taking: Reported on 01/16/2017) 30 capsule 3  . ondansetron (ZOFRAN ODT) 4 MG disintegrating tablet Take 1 tablet (4 mg total) by mouth every 8 (eight) hours as needed for nausea or vomiting. (Patient not taking: Reported on 01/16/2017) 20 tablet 0  . ondansetron (ZOFRAN) 4 MG tablet Take 1 tablet (4 mg total) by mouth every 8 (eight) hours as needed for nausea or vomiting. (Patient not taking: Reported on 01/16/2017) 20 tablet 0  . polyethylene glycol powder (GLYCOLAX/MIRALAX) powder Take 17 g by mouth daily. (Patient not taking: Reported on 01/16/2017) 3350 g 1   . PRESCRIPTION MEDICATION 2 medications from school clinic (one for pain)    . ranitidine (ZANTAC) 150 MG tablet Take 1 tablet (150 mg total) by mouth 2 (two) times daily. (Patient not taking: Reported on 01/16/2017) 60 tablet 2  . sucralfate (CARAFATE) 1 GM/10ML suspension Take 10 mLs (1 g total) by mouth 4 (four) times daily -  with meals and at bedtime. (Patient not taking: Reported on 01/16/2017) 420 mL 0  . traZODone (DESYREL) 50 MG tablet Take 0.5 tablets (25 mg total) by mouth at bedtime as needed for sleep. (Patient not taking: Reported on 01/16/2017) 30 tablet 0   No facility-administered medications prior to visit.      ROS Review of Systems  Constitutional: Negative for chills, fever and malaise/fatigue.  Eyes: Negative for blurred vision.  Respiratory: Negative for shortness of breath.   Cardiovascular: Negative for chest pain and palpitations.  Gastrointestinal: Positive for abdominal pain. Negative for nausea.  Genitourinary: Negative for dysuria and hematuria.  Musculoskeletal: Negative for joint pain and myalgias.  Skin: Negative for rash.  Neurological: Negative for tingling and headaches.  Psychiatric/Behavioral: Negative for depression. The patient is not nervous/anxious.     Objective:  BP 107/62 (BP Location: Right Arm, Patient Position: Sitting, Cuff Size: Normal)   Pulse 82   Temp 98.3 F (36.8 C) (Oral)   Wt 124 lb 3.2 oz (56.3 kg)   LMP 12/31/2016 (Approximate)   SpO2 96%   BMI 23.86 kg/m   BP/Weight 01/16/2017 09/08/2016 08/10/2016  Systolic BP 107  111 113  Diastolic BP 62 77 78  Wt. (Lbs) 124.2 118 -  BMI 23.86 22.67 -      Physical Exam  Constitutional: She is oriented to person, place, and time.  Well developed, well nourished, NAD, polite  HENT:  Head: Normocephalic and atraumatic.  Eyes: No scleral icterus.  Neck: Normal range of motion. Neck supple. No thyromegaly present.  Cardiovascular: Normal rate, regular rhythm and normal heart  sounds.   Pulmonary/Chest: Effort normal and breath sounds normal.  Abdominal: Soft. Bowel sounds are normal. There is tenderness (epigastric TTP).  Musculoskeletal: She exhibits no edema.  Neurological: She is alert and oriented to person, place, and time.  Skin: Skin is warm and dry. No rash noted. No erythema. No pallor.  Psychiatric: She has a normal mood and affect. Her behavior is normal. Thought content normal.  Vitals reviewed.    Assessment & Plan:    1. H. pylori infection - omeprazole (PRILOSEC) 40 MG capsule; Take 1 capsule (40 mg total) by mouth daily.  Dispense: 30 capsule; Refill: 3 - H. pylori breath test  2. Abdominal pain, chronic, epigastric - omeprazole (PRILOSEC) 40 MG capsule; Take 1 capsule (40 mg total) by mouth daily.  Dispense: 30 capsule; Refill: 3 - H. pylori breath test  3. RUQ abdominal pain - US Abdomen Limited RUQ; Future   Meds ordered this encounter  Medications  . omeprazole (PRILOSEC) 40 MG capsule    Sig: Take 1 capsule (40 mg total) by mouth daily.    Dispense:  30 capsule    Refill:  3    Order Specific Question:   Supervising Provider    Answer:   Quentin AngstJEGEDE, OLUGBEMIGA E [id number]    Follow-up: Return in about 4 weeks (around 02/13/2017) for f/u h pylori.   Loletta Specteroger David Toneshia Coello PA

## 2017-01-16 NOTE — Patient Instructions (Signed)
Helicobacter Pylori Infection  Helicobacter pylori infection is an infection in the stomach that is caused by the Helicobacter pylori (H. pylori) bacteria. This type of bacteria often lives in the lining of the stomach. The infection can cause ulcers and irritation (gastritis) in some people. It is the most common cause of ulcers in the stomach (gastric ulcer) and in the upper part of the intestine (duodenal ulcer). Having this infection may also increase the risk of stomach cancer and a type of white blood cell cancer (lymphoma) that affects the stomach.  What are the causes?  H. pylori is a type of bacteria that is often found in the stomachs of healthy people. The bacteria may be passed from person to person through contact with stool or saliva. It is not known why some people develop ulcers, gastritis, or cancer from the infection.  What increases the risk?  This condition is more likely to develop in people who:  · Have family members with the infection.  · Live with many other people, such as in a dormitory.  · Are of African, Hispanic, or Asian descent.     What are the signs or symptoms?  Most people with this infection do not have symptoms. If you do have symptoms, they may include:  · Heartburn.  · Stomach pain.  · Nausea.  · Vomiting.  · Blood-tinged vomit.  · Loss of appetite.  · Bad breath.     How is this diagnosed?  This condition may be diagnosed based on your symptoms, a physical exam, and various tests. Tests may include:  · Blood tests or stool tests to check for the proteins (antibodies) that your body may produce in response to the bacteria. These tests are the best way to confirm the diagnosis.  · A breath test to check for the type of gas that the H. pylori bacteria release after breaking down a substance called urea. For the test, you are asked to drink urea. This test is often done after treatment in order to find out if the treatment worked.  · A procedure in which a thin, flexible tube  with a tiny camera at the end is placed into your stomach and upper intestine (upper endoscopy). Your health care provider may also take tissue samples (biopsy) to test for H. pylori and cancer.     How is this treated?  Treatment for this condition usually involves taking a combination of medicines (triple therapy) for a couple of weeks. Triple therapy includes one medicine to reduce the acid in your stomach and two types of antibiotic medicines. Many drug combinations have been approved for treatment. Treatment usually kills the H. pylori and reduces your risk of cancer. You may need to be tested for H. pylori again after treatment. In some cases, the treatment may need to be repeated.  Follow these instructions at home:  ·   · Take over-the-counter and prescription medicines only as told by your health care provider.  · Take your antibiotics as told by your health care provider. Do not stop taking the antibiotics even if you start to feel better.  · You can do all your usual activities and eat what you usually do.  · Take steps to prevent future infections:  ? Wash your hands often.  ? Make sure the food you eat has been properly prepared.  ? Drink water only from clean sources.  · Keep all follow-up visits as told by your health care provider. This is important.  Contact   a health care provider if:  · Your symptoms do not get better.  · Your symptoms return after treatment.  This information is not intended to replace advice given to you by your health care provider. Make sure you discuss any questions you have with your health care provider.  Document Released: 10/01/2015 Document Revised: 11/15/2015 Document Reviewed: 06/21/2014  Elsevier Interactive Patient Education © 2018 [employer] Inc.   

## 2017-01-19 LAB — H. PYLORI BREATH TEST: H PYLORI BREATH TEST: NEGATIVE

## 2017-01-20 ENCOUNTER — Ambulatory Visit (HOSPITAL_COMMUNITY)
Admission: RE | Admit: 2017-01-20 | Discharge: 2017-01-20 | Disposition: A | Discharge status: Home / Self Care | Payer: BLUE CROSS/BLUE SHIELD | Source: Ambulatory Visit | Attending: Physician Assistant | Admitting: Physician Assistant

## 2017-01-20 ENCOUNTER — Other Ambulatory Visit (INDEPENDENT_AMBULATORY_CARE_PROVIDER_SITE_OTHER): Payer: Self-pay | Admitting: Physician Assistant

## 2017-01-20 DIAGNOSIS — R1013 Epigastric pain: Secondary | ICD-10-CM | POA: Insufficient documentation

## 2017-01-20 DIAGNOSIS — R1011 Right upper quadrant pain: Secondary | ICD-10-CM | POA: Insufficient documentation

## 2017-01-29 ENCOUNTER — Ambulatory Visit (HOSPITAL_COMMUNITY): Payer: BLUE CROSS/BLUE SHIELD

## 2018-09-14 ENCOUNTER — Ambulatory Visit (INDEPENDENT_AMBULATORY_CARE_PROVIDER_SITE_OTHER): Payer: BLUE CROSS/BLUE SHIELD | Admitting: Primary Care

## 2018-10-18 ENCOUNTER — Ambulatory Visit: Payer: BLUE CROSS/BLUE SHIELD | Admitting: Primary Care

## 2018-10-18 ENCOUNTER — Ambulatory Visit (INDEPENDENT_AMBULATORY_CARE_PROVIDER_SITE_OTHER): Payer: BLUE CROSS/BLUE SHIELD | Admitting: Primary Care

## 2019-01-14 IMAGING — US US ABDOMEN LIMITED
1 series · 14 of 25 positions shown · non-contrast
Comparison: None.

CLINICAL DATA: Right upper quadrant pain

EXAM:
ULTRASOUND ABDOMEN LIMITED RIGHT UPPER QUADRANT

[Series 1: us abdomen limited · 0.19mm/px · 14 of 35 slices shown]
[im 1/35]
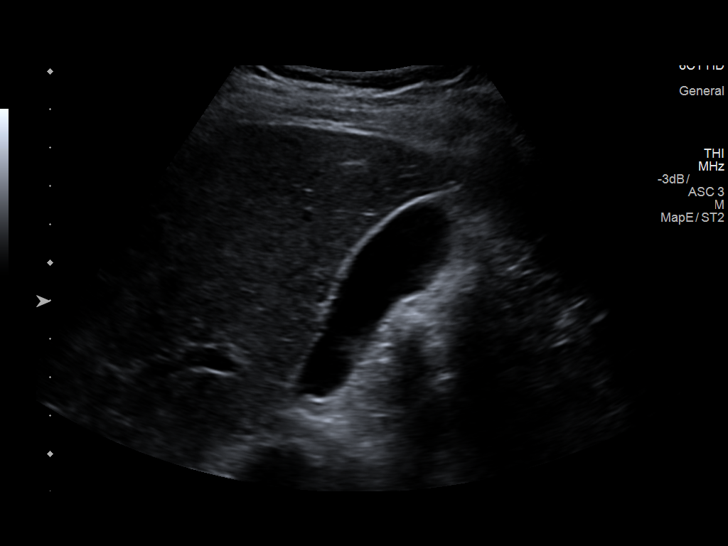
[im 3/35]
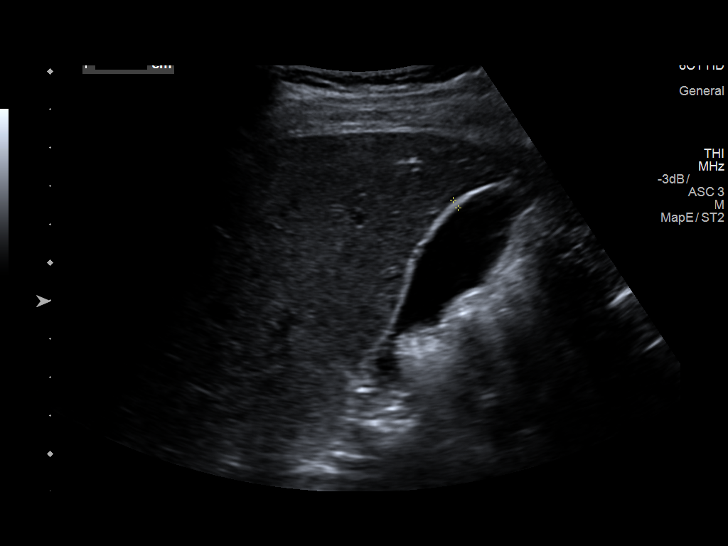
[im 6/35]
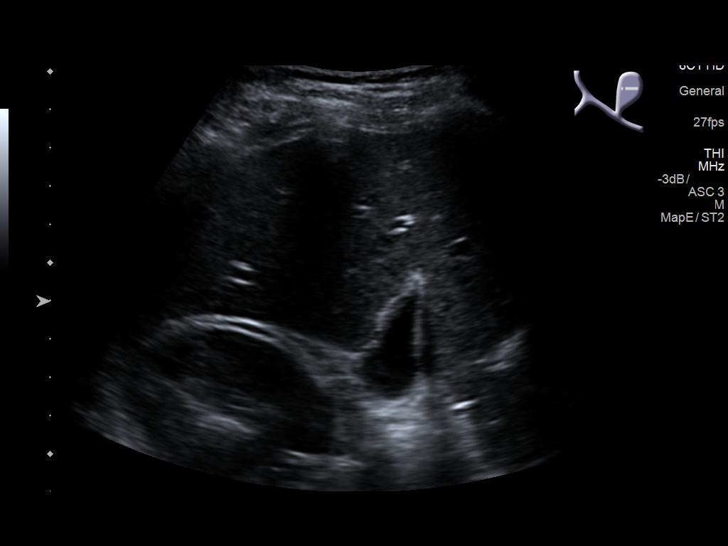
[im 9/35]
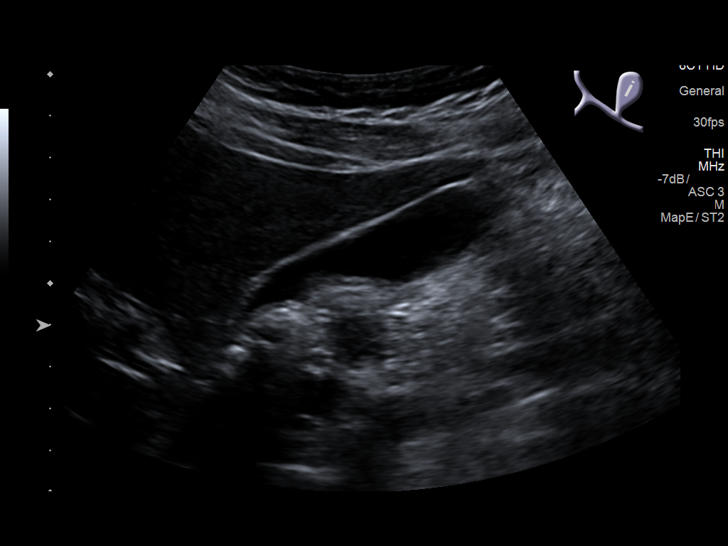
[im 12/35]
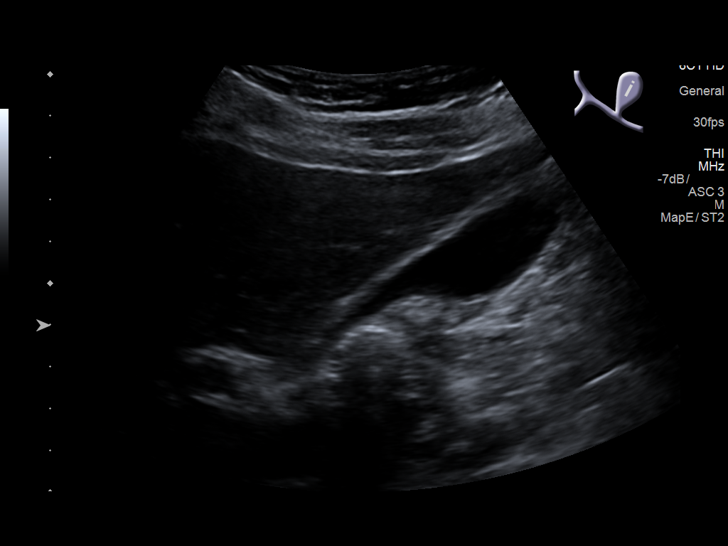
[im 13/35]
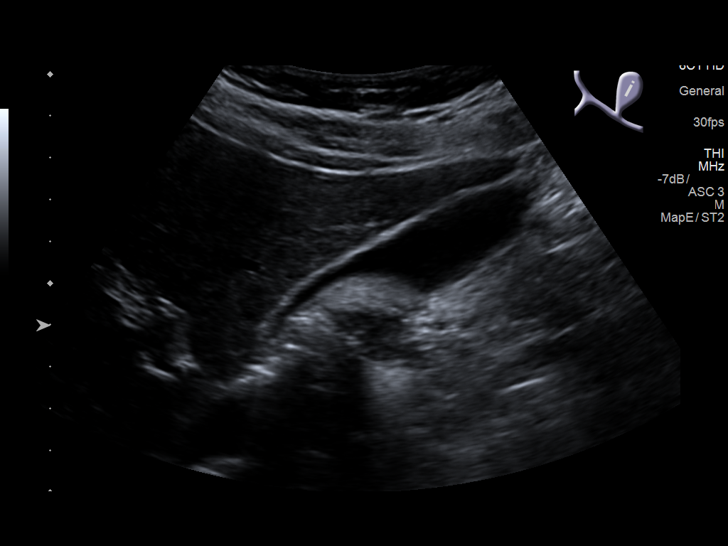
[im 16/35]
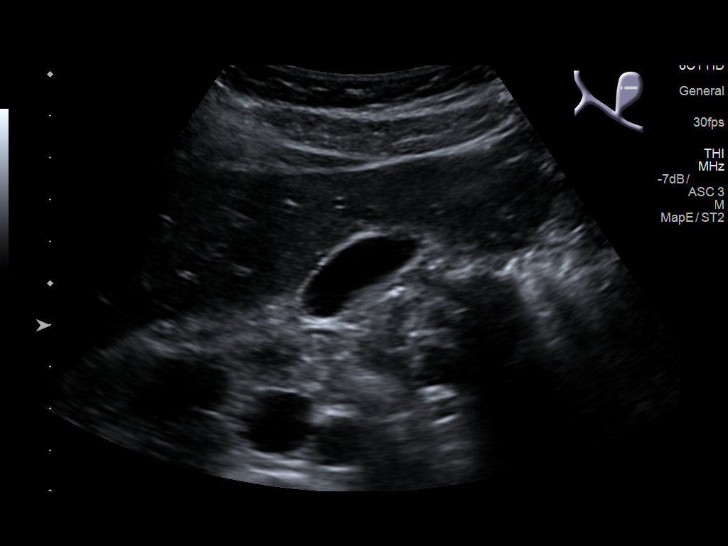
[im 19/35]
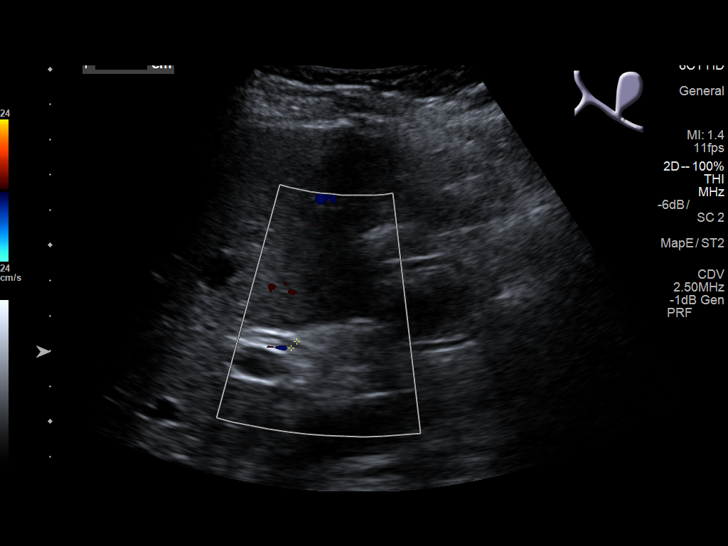
[im 22/35]
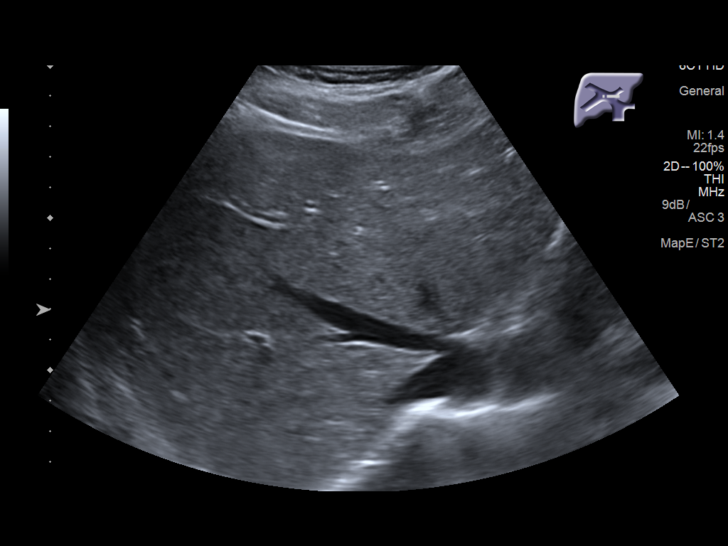
[im 23/35]
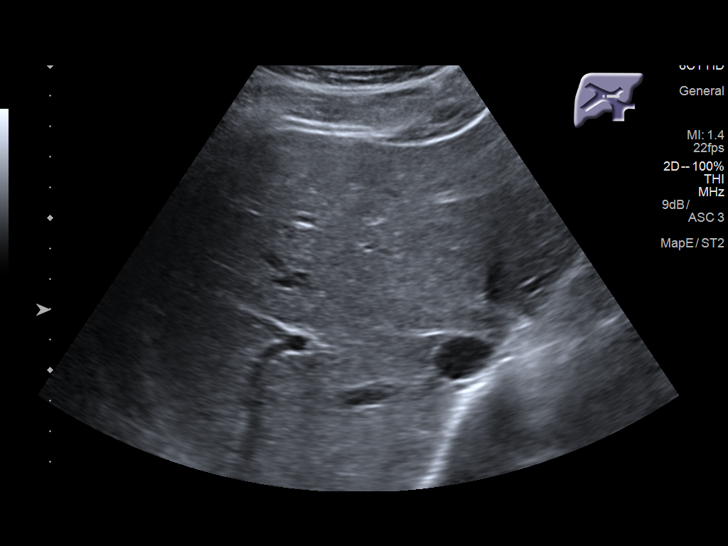
[im 26/35]
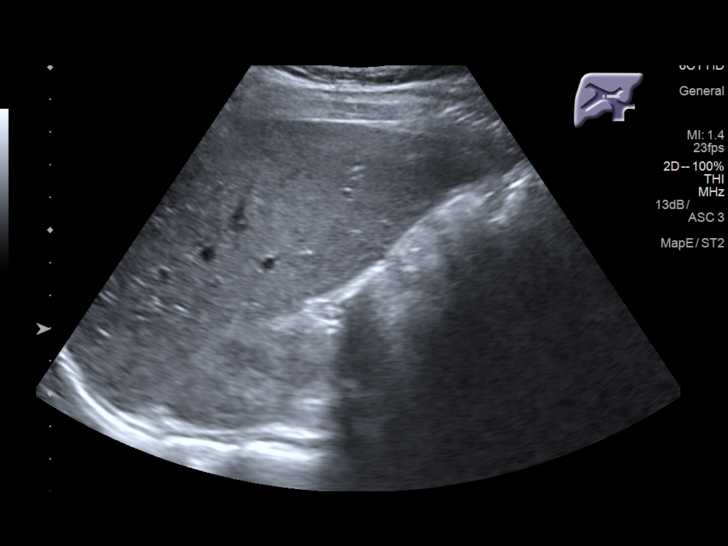
[im 29/35]
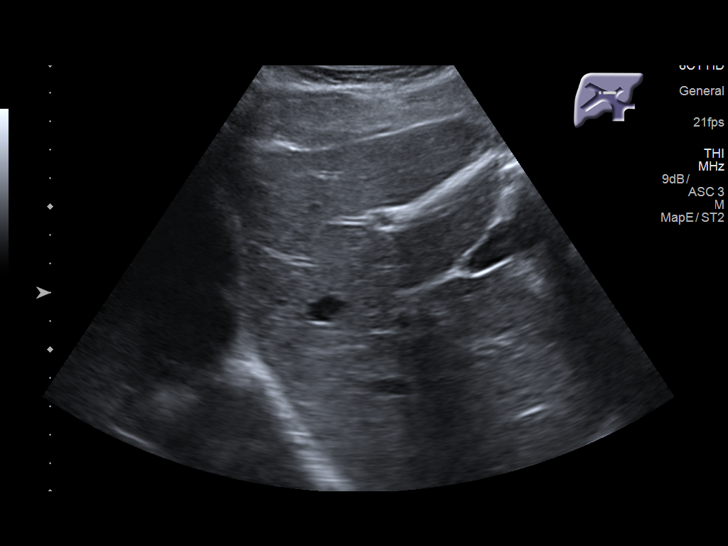
[im 32/35]
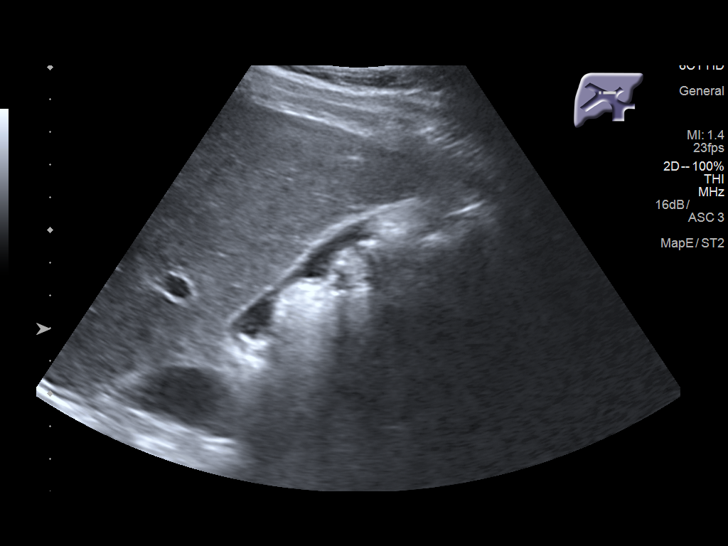
[im 35/35]
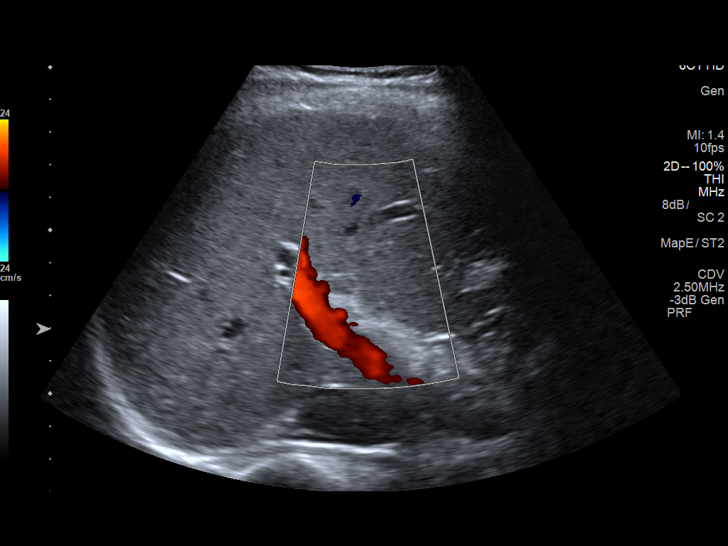

[14 of 25 positions shown; findings below may reference images not displayed]

FINDINGS: Gallbladder:

No gallstones or wall thickening visualized. There is no
pericholecystic fluid. No sonographic Murphy sign noted by
sonographer.

Common bile duct:

Diameter: 2 mm. No intrahepatic or extrahepatic biliary duct
dilatation.

Liver:

No focal lesion identified. Within normal limits in parenchymal
echogenicity.
IMPRESSION: Study within normal limits.

## 2019-03-16 ENCOUNTER — Other Ambulatory Visit: Payer: Self-pay

## 2019-03-16 ENCOUNTER — Encounter (INDEPENDENT_AMBULATORY_CARE_PROVIDER_SITE_OTHER): Payer: Self-pay | Admitting: Primary Care

## 2019-03-16 ENCOUNTER — Ambulatory Visit (INDEPENDENT_AMBULATORY_CARE_PROVIDER_SITE_OTHER): Payer: BC Managed Care – PPO | Admitting: Primary Care

## 2019-03-16 VITALS — BP 116/77 | HR 68 | Temp 97.5°F | Ht 65.0 in | Wt 122.8 lb

## 2019-03-16 DIAGNOSIS — Z30019 Encounter for initial prescription of contraceptives, unspecified: Secondary | ICD-10-CM

## 2019-03-16 DIAGNOSIS — Z23 Encounter for immunization: Secondary | ICD-10-CM

## 2019-03-16 DIAGNOSIS — Z0000 Encounter for general adult medical examination without abnormal findings: Secondary | ICD-10-CM | POA: Diagnosis not present

## 2019-03-16 DIAGNOSIS — N912 Amenorrhea, unspecified: Secondary | ICD-10-CM | POA: Diagnosis not present

## 2019-03-16 DIAGNOSIS — Z3201 Encounter for pregnancy test, result positive: Secondary | ICD-10-CM

## 2019-03-16 DIAGNOSIS — K219 Gastro-esophageal reflux disease without esophagitis: Secondary | ICD-10-CM

## 2019-03-16 LAB — POCT URINE PREGNANCY: Preg Test, Ur: POSITIVE — AB

## 2019-03-16 MED ORDER — MEDROXYPROGESTERONE ACETATE 150 MG/ML IM SUSP: 150.0000 mg | Freq: Once | INTRAMUSCULAR | Status: DC

## 2019-03-16 NOTE — Progress Notes (Signed)
Established Patient Office Visit  Subjective:  Patient ID: Ashley Holt, female    DOB: 30-Aug-1990  Age: 28 y.o. MRN: [id number]  CC:  Chief Complaint  Patient presents with  . Establish Care    amenorrhea     HPI [name] presents for amenorrhea last menstrual cycle July 15,2020 she is approximately six weeks late will check a pregnancy test. Ms. Losasso works at [employer] which had a COVID out break she has been tested twice and states both times she was negative. Annual visit.  Past Medical History:  Diagnosis Date  . GERD (gastroesophageal reflux disease)     No past surgical history on file.  No family history on file.  Social History   Socioeconomic History  . Marital status: Single    Spouse name: Not on file  . Number of children: Not on file  . Years of education: Not on file  . Highest education level: Not on file  Occupational History  . Not on file  Social Needs  . Financial resource strain: Not on file  . Food insecurity    Worry: Not on file    Inability: Not on file  . Transportation needs    Medical: Not on file    Non-medical: Not on file  Tobacco Use  . Smoking status: Never Smoker  . Smokeless tobacco: Never Used  Substance and Sexual Activity  . Alcohol use: No    Alcohol/week: 0.0 standard drinks  . Drug use: No  . Sexual activity: Never    Birth control/protection: None    Comment: last sexual intercourse 7 months prior to arrival in [place]   Lifestyle  . Physical activity    Days per week: Not on file    Minutes per session: Not on file  . Stress: Not on file  Relationships  . Social Musician on phone: Not on file    Gets together: Not on file    Attends religious service: Not on file    Active member of club or organization: Not on file    Attends meetings of clubs or organizations: Not on file    Relationship status: Not on file  . Intimate partner violence    Fear of current or ex partner: Not on file   Emotionally abused: Not on file    Physically abused: Not on file    Forced sexual activity: Not on file  Other Topics Concern  . Not on file  Social History Narrative   ** Merged History Encounter **        Outpatient Medications Prior to Visit  Medication Sig Dispense Refill  . EPINEPHrine 0.3 mg/0.3 mL IJ SOAJ injection Inject 0.3 mLs (0.3 mg total) into the muscle once. 1 Device 0  . omeprazole (PRILOSEC) 40 MG capsule Take 1 capsule (40 mg total) by mouth daily. 30 capsule 3  . PRESCRIPTION MEDICATION 2 medications from school clinic (one for pain)     No facility-administered medications prior to visit.     No Known Allergies  ROS Review of Systems  All other systems reviewed and are negative.     Objective:    Physical Exam  Constitutional: She is oriented to person, place, and time. She appears well-developed and well-nourished.  HENT:  Head: Normocephalic.  Eyes: Pupils are equal, round, and reactive to light. EOM are normal.  Neck: Normal range of motion.  Cardiovascular: Normal rate and regular rhythm.  Pulmonary/Chest: Effort normal and  breath sounds normal.  Abdominal: Soft. Bowel sounds are normal.  Musculoskeletal: Normal range of motion.  Neurological: She is alert and oriented to person, place, and time.  Skin: Skin is warm and dry.  Psychiatric: She has a normal mood and affect.    BP 116/77 (BP Location: Right Arm, Patient Position: Sitting, Cuff Size: Normal)   Pulse 68   Temp (!) 97.5 F (36.4 C) (Tympanic)   Ht 5\' 5"  (1.651 m)   Wt 122 lb 12.8 oz (55.7 kg)   LMP 01/05/2019 (Exact Date)   SpO2 94%   BMI 20.43 kg/m  Wt Readings from Last 3 Encounters:  03/16/19 122 lb 12.8 oz (55.7 kg)  01/16/17 124 lb 3.2 oz (56.3 kg)  09/08/16 118 lb (53.5 kg)     Health Maintenance Due  Topic Date Due  . HIV Screening  06/24/2003  . TETANUS/TDAP  06/24/2007  . PAP SMEAR-Modifier  06/23/2009  . INFLUENZA VACCINE  01/22/2019    There are no  preventive care reminders to display for this patient.  No results found for: TSH Lab Results  Component Value Date   WBC 5.1 09/08/2016   HGB 12.4 09/08/2016   HCT 36.9 09/08/2016   MCV 86 09/08/2016   PLT 267 09/08/2016   Lab Results  Component Value Date   NA 141 09/08/2016   K 4.0 09/08/2016   CO2 24 09/08/2016   GLUCOSE 82 09/08/2016   BUN 9 09/08/2016   CREATININE 0.64 09/08/2016   BILITOT 0.3 09/08/2016   ALKPHOS 52 09/08/2016   AST 14 09/08/2016   ALT 12 09/08/2016   PROT 6.9 09/08/2016   ALBUMIN 4.4 09/08/2016   CALCIUM 9.3 09/08/2016   ANIONGAP 10 08/10/2016   No results found for: CHOL No results found for: HDL No results found for: LDLCALC No results found for: TRIG No results found for: CHOLHDL No results found for: HGBA1C    Assessment & Plan:  Denay was seen today for establish care.  Diagnoses and all orders for this visit:  Amenorrhea -     POCT urine pregnancy -     CBC with Differential -     Beta hCG quant (ref lab)  Annual physical exam  Gastroesophageal reflux disease without esophagitis  Encounter for female birth control -     Discontinue: medroxyPROGESTERone (DEPO-PROVERA) injection 150 mg  Positive pregnancy test -     hCG, quantitative, pregnancy  Healthcare maintenance -     HIV antibody (with reflex)  Need for immunization against influenza CDC recommends influenza vaccine yearly.  Flu intercanthus respiratory illness caused by influenza virus that affects the nose throat and sometimes the lungs.  Experts believe that liver spread managed by tract was made mainly by tiny droplets made when people with the flu cough sneeze or talk. -     Flu Vaccine QUAD 36+ mos IM  Need for Tdap vaccination Tdap is recommended every 10 years for adults weekly or primary she gets tetanus..  At least 1 of those doses should be with Tdap in adults age 82 and older who have previously received Tdap.  Recommend by the CDC. -     Tdap vaccine  greater than or equal to 7yo IM    No orders of the defined types were placed in this encounter.   Follow-up: No follow-ups on file.    Kerin Perna, NP

## 2019-03-17 LAB — CBC WITH DIFFERENTIAL/PLATELET
Basophils Absolute: 0 10*3/uL (ref 0.0–0.2)
Basos: 0 %
EOS (ABSOLUTE): 0.4 10*3/uL (ref 0.0–0.4)
Eos: 6 %
Hematocrit: 31.6 % — ABNORMAL LOW (ref 34.0–46.6)
Hemoglobin: 11 g/dL — ABNORMAL LOW (ref 11.1–15.9)
Immature Grans (Abs): 0 10*3/uL (ref 0.0–0.1)
Immature Granulocytes: 0 %
Lymphocytes Absolute: 2.3 10*3/uL (ref 0.7–3.1)
Lymphs: 34 %
MCH: 29.3 pg (ref 26.6–33.0)
MCHC: 34.8 g/dL (ref 31.5–35.7)
MCV: 84 fL (ref 79–97)
Monocytes Absolute: 0.6 10*3/uL (ref 0.1–0.9)
Monocytes: 9 %
Neutrophils Absolute: 3.4 10*3/uL (ref 1.4–7.0)
Neutrophils: 51 %
Platelets: 313 10*3/uL (ref 150–450)
RBC: 3.75 x10E6/uL — ABNORMAL LOW (ref 3.77–5.28)
RDW: 11.8 % (ref 11.7–15.4)
WBC: 6.7 10*3/uL (ref 3.4–10.8)

## 2019-03-17 LAB — HIV ANTIBODY (ROUTINE TESTING W REFLEX): HIV Screen 4th Generation wRfx: NONREACTIVE

## 2019-03-17 LAB — BETA HCG QUANT (REF LAB): hCG Quant: 150057 m[IU]/mL

## 2019-03-19 ENCOUNTER — Other Ambulatory Visit (INDEPENDENT_AMBULATORY_CARE_PROVIDER_SITE_OTHER): Payer: Self-pay | Admitting: Primary Care

## 2019-03-19 DIAGNOSIS — Z3A01 Less than 8 weeks gestation of pregnancy: Secondary | ICD-10-CM

## 2019-03-21 ENCOUNTER — Telehealth (INDEPENDENT_AMBULATORY_CARE_PROVIDER_SITE_OTHER): Payer: Self-pay

## 2019-03-21 NOTE — Telephone Encounter (Signed)
-----   Message from Kerin Perna, NP sent at 03/19/2019  9:07 PM EDT ----- Patient is between 6-[redacted] weeks pregnant referred to GYN

## 2019-03-21 NOTE — Telephone Encounter (Signed)
Patient is aware that HIV is negative and all other labs normal. Approximately 6-[redacted] weeks pregnant; referred to GYN. Someone from their office will call to schedule appointment. Nat Christen, CMA

## 2019-03-24 ENCOUNTER — Other Ambulatory Visit (INDEPENDENT_AMBULATORY_CARE_PROVIDER_SITE_OTHER): Payer: Self-pay | Admitting: Primary Care

## 2019-03-24 DIAGNOSIS — Z3A01 Less than 8 weeks gestation of pregnancy: Secondary | ICD-10-CM
